# Patient Record
Sex: Female | Born: 1960 | Race: White | Hispanic: No | Marital: Married | Smoking: Former smoker
Health system: Southern US, Community
[De-identification: ages and names within clinical notes are randomized; demographics above are authoritative.]

## PROBLEM LIST (undated history)

## (undated) DIAGNOSIS — F329 Major depressive disorder, single episode, unspecified: Secondary | ICD-10-CM

## (undated) DIAGNOSIS — I10 Essential (primary) hypertension: Secondary | ICD-10-CM

## (undated) DIAGNOSIS — Z973 Presence of spectacles and contact lenses: Secondary | ICD-10-CM

## (undated) DIAGNOSIS — R12 Heartburn: Secondary | ICD-10-CM

## (undated) DIAGNOSIS — F32A Depression, unspecified: Secondary | ICD-10-CM

## (undated) DIAGNOSIS — K219 Gastro-esophageal reflux disease without esophagitis: Secondary | ICD-10-CM

## (undated) DIAGNOSIS — E785 Hyperlipidemia, unspecified: Secondary | ICD-10-CM

## (undated) HISTORY — PX: KNEE SURGERY: SHX244

## (undated) HISTORY — DX: Depression, unspecified: F32.A

## (undated) HISTORY — DX: Essential (primary) hypertension: I10

## (undated) HISTORY — DX: Hyperlipidemia, unspecified: E78.5

## (undated) HISTORY — DX: Major depressive disorder, single episode, unspecified: F32.9

## (undated) HISTORY — DX: Heartburn: R12

---

## 2004-01-11 HISTORY — PX: TONSILLECTOMY: SUR1361

## 2004-03-01 ENCOUNTER — Ambulatory Visit: Payer: Self-pay

## 2004-09-22 ENCOUNTER — Ambulatory Visit: Payer: Self-pay | Admitting: Otolaryngology

## 2004-12-15 ENCOUNTER — Ambulatory Visit: Payer: Self-pay | Admitting: Otolaryngology

## 2005-04-11 ENCOUNTER — Ambulatory Visit: Payer: Self-pay

## 2006-04-13 ENCOUNTER — Ambulatory Visit: Payer: Self-pay

## 2007-04-16 ENCOUNTER — Ambulatory Visit: Payer: Self-pay

## 2007-08-16 ENCOUNTER — Encounter (INDEPENDENT_AMBULATORY_CARE_PROVIDER_SITE_OTHER): Payer: Self-pay | Admitting: Orthopedic Surgery

## 2007-08-16 ENCOUNTER — Ambulatory Visit (HOSPITAL_COMMUNITY): Admission: RE | Admit: 2007-08-16 | Discharge: 2007-08-17 | Payer: Self-pay | Admitting: Orthopedic Surgery

## 2008-03-14 ENCOUNTER — Ambulatory Visit: Payer: Self-pay

## 2008-05-15 ENCOUNTER — Ambulatory Visit: Payer: Self-pay

## 2009-07-02 ENCOUNTER — Ambulatory Visit: Payer: Self-pay

## 2010-05-25 NOTE — Op Note (Signed)
Hannah Ferguson, Hannah Ferguson                  ACCOUNT NO.:  [id number]   MEDICAL RECORD NO.:  [id number]          PATIENT TYPE:  AMB   LOCATION:  DAY                          FACILITY:  [facility]   PHYSICIAN:  Georges Lynch. Gioffre, M.D.DATE OF BIRTH:  07-08-1960   DATE OF PROCEDURE:  08/16/2007  DATE OF DISCHARGE:                               OPERATIVE REPORT   ADDENDUM:  I just dictated a surgical dictation.  Please send a copy of  that surgical dictation to the referring person named Yolanda Bonine,  C.N.M. at the [facility] in [place].           ______________________________  Georges Lynch. Darrelyn Hillock, M.D.     RAG/MEDQ  D:  08/16/2007  T:  08/16/2007  Job:  [id number]

## 2010-05-25 NOTE — Op Note (Signed)
NAMESHANEN, NORRIS                  ACCOUNT NO.:  [id number]   MEDICAL RECORD NO.:  [id number]          PATIENT TYPE:  AMB   LOCATION:  DAY                          FACILITY:  [facility]   PHYSICIAN:  Georges Lynch. Gioffre, M.D.DATE OF BIRTH:  [birth year]   DATE OF PROCEDURE:  08/16/2007  DATE OF DISCHARGE:                               OPERATIVE REPORT   SURGEON:  Georges Lynch. Darrelyn Hillock, M.D.   ASSISTANT:  Nurse.   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   OPERATION:  Excision of a large osteochondroma right distal lateral  femur.   PROCEDURE:  Under general anesthesia, routine orthopedic prep and  draping of the right lower extremity was carried out.  The patient had 2  g IV Ancef preop.  At this time, the leg was exsanguinated with an  Esmarch, and the tourniquet was elevated to 350 mmHg.  An incision was  made over the lateral aspect of the right distal femur.  Bleeders were  identified and cauterized.  The incision was carried down through the  iliotibial band.  I then split the vastus lateralis.  I then identified  a large osteochondroma with a cartilaginous cap.  I then utilized the  osteotome and made multiple passes to remove the osteochondroma.  I  thoroughly irrigated out the area and made sure there were no loose  fragments present.  Following that, I bone waxed the raw end of the  bone.  At this particular time, I closed the vastus lateralis muscle  with #1 Vicryl sutures.  The iliotibial band was closed with #1 Vicryl  suture, subcutaneous with 0 Vicryl, skin with metal staples, and a  sterile Neosporin dressing was applied.  I did insert some thrombin-  soaked Gelfoam for hemostasis purposes into the wound.  Sterile  dressings were applied.  The patient left the operating room in  satisfactory condition postop.   1. Specimen was sent to  the lab for analysis to make sure we were not      dealing with a malignant tumor.  2. I am going to put her on a Coumadin and heparin protocol.  3. I am going to keep her overnight for observation.           ______________________________  Georges Lynch Darrelyn Hillock, M.D.     RAG/MEDQ  D:  08/16/2007  T:  08/16/2007  Job:  [id number]

## 2010-06-09 ENCOUNTER — Ambulatory Visit (INDEPENDENT_AMBULATORY_CARE_PROVIDER_SITE_OTHER): Payer: BC Managed Care – PPO | Admitting: Family Medicine

## 2010-06-09 ENCOUNTER — Encounter: Payer: Self-pay | Admitting: Family Medicine

## 2010-06-09 DIAGNOSIS — R300 Dysuria: Secondary | ICD-10-CM

## 2010-06-09 DIAGNOSIS — Z0000 Encounter for general adult medical examination without abnormal findings: Secondary | ICD-10-CM | POA: Insufficient documentation

## 2010-06-09 DIAGNOSIS — E785 Hyperlipidemia, unspecified: Secondary | ICD-10-CM | POA: Insufficient documentation

## 2010-06-09 DIAGNOSIS — N390 Urinary tract infection, site not specified: Secondary | ICD-10-CM

## 2010-06-09 DIAGNOSIS — Z1231 Encounter for screening mammogram for malignant neoplasm of breast: Secondary | ICD-10-CM

## 2010-06-09 DIAGNOSIS — I10 Essential (primary) hypertension: Secondary | ICD-10-CM | POA: Insufficient documentation

## 2010-06-09 LAB — POCT URINALYSIS DIPSTICK
Bilirubin, UA: NEGATIVE
Glucose, UA: NEGATIVE
Nitrite, UA: NEGATIVE
Spec Grav, UA: 1.015
Urobilinogen, UA: NEGATIVE

## 2010-06-09 LAB — BASIC METABOLIC PANEL
BUN: 16 mg/dL (ref 6–23)
Calcium: 9.5 mg/dL (ref 8.4–10.5)
Creatinine, Ser: 0.8 mg/dL (ref 0.4–1.2)
GFR: 77.97 mL/min (ref >60.00)
Potassium: 3.9 mEq/L (ref 3.5–5.1)

## 2010-06-09 LAB — LIPID PANEL
Cholesterol: 240 mg/dL — ABNORMAL HIGH (ref 0–200)
Total CHOL/HDL Ratio: 4
VLDL: 27 mg/dL (ref 0.0–40.0)

## 2010-06-09 LAB — LDL CHOLESTEROL, DIRECT: Direct LDL: 185.9 mg/dL

## 2010-06-09 MED ORDER — LEVONORGEST-ETH ESTRAD 91-DAY 0.15-0.03 MG PO TABS: 1.0000 | ORAL_TABLET | Freq: Every day | ORAL | Status: DC

## 2010-06-09 MED ORDER — CIPROFLOXACIN HCL 500 MG PO TABS: 500.0000 mg | ORAL_TABLET | Freq: Two times a day (BID) | ORAL | Status: AC

## 2010-06-09 MED ORDER — HYDROCHLOROTHIAZIDE 25 MG PO TABS: 25.0000 mg | ORAL_TABLET | Freq: Every day | ORAL | Status: DC

## 2010-06-09 NOTE — Progress Notes (Signed)
50 yo here to establish care and for CPX.  Well woman- G2P2.   No h/o abnormal pap smears. Last pap smear was 05/2009.  See is still menstruating. OCPs have been helping regulate her periods and perimenopausal symptoms.    Dysuria- had a colonoscopy two weeks ago. Shortly after colonoscopy, developed some dysuria, increased urinary frequency. No fevers, chills, back pain, nausea or vomiting.  The PMH, PSH, Social History, Family History, Medications, and allergies have been reviewed in [facility], and have been updated if relevant.  ROS: See HPI Patient reports no  vision/ hearing changes,anorexia, weight change, fever ,adenopathy, persistant / recurrent hoarseness, swallowing issues, chest pain, edema,persistant / recurrent cough, hemoptysis, dyspnea(rest, exertional, paroxysmal nocturnal), gastrointestinal  bleeding (melena, rectal bleeding), abdominal pain, excessive heart burn, syncope, focal weakness, severe memory loss, concerning skin lesions, depression, anxiety, abnormal bruising/bleeding, major joint swelling, breast masses or abnormal vaginal bleeding.    Physical exam: BP 150/80  Pulse 78  Temp(Src) 98.4 F (36.9 C) (Oral)  Ht 5\' 2"  (1.575 m)  Wt 206 lb 1.9 oz (93.495 kg)  BMI 37.70 kg/m2  General:  Well-developed,well-nourished,in no acute distress; alert,appropriate and cooperative throughout examination Head:  normocephalic and atraumatic.   Eyes:  vision grossly intact, pupils equal, pupils round, and pupils reactive to light.   Ears:  R ear normal and L ear normal.   Nose:  no external deformity.   Mouth:  good dentition.   Neck:  No deformities, masses, or tenderness noted. Breasts:  No mass, nodules, thickening, tenderness, bulging, retraction, inflamation, nipple discharge or skin changes noted.   Lungs:  Normal respiratory effort, chest expands symmetrically. Lungs are clear to auscultation, no crackles or wheezes. Heart:  Normal rate and regular rhythm. S1 and S2  normal without gallop, murmur, click, rub or other extra sounds. Abdomen:  Bowel sounds positive,abdomen soft and non-tender without masses, organomegaly or hernias noted. Msk:  No deformity or scoliosis noted of thoracic or lumbar spine.   Extremities:  No clubbing, cyanosis, edema, or deformity noted with normal full range of motion of all joints.   Neurologic:  alert & oriented X3 and gait normal.   Skin:  Intact without suspicious lesions or rashes Cervical Nodes:  No lymphadenopathy noted Axillary Nodes:  No palpable lymphadenopathy Psych:  Cognition and judgment appear intact. Alert and cooperative with normal attention span and concentration. No apparent delusions, illusions, hallucinations

## 2010-06-09 NOTE — Assessment & Plan Note (Signed)
New. UA positive. Start 3 day course of Cipro.

## 2010-06-09 NOTE — Patient Instructions (Signed)
Please stop by to see Hannah Ferguson on your way out. It was great to meet you.

## 2010-06-10 ENCOUNTER — Other Ambulatory Visit: Payer: Self-pay | Admitting: *Deleted

## 2010-06-10 MED ORDER — SIMVASTATIN 10 MG PO TABS: 10.0000 mg | ORAL_TABLET | Freq: Every day | ORAL | Status: DC

## 2010-06-10 NOTE — Progress Notes (Signed)
Addended by: Dianne Dun on: 06/10/2010 12:56 PM   Modules accepted: Orders

## 2010-06-20 ENCOUNTER — Ambulatory Visit: Payer: Self-pay | Admitting: Internal Medicine

## 2010-07-15 ENCOUNTER — Ambulatory Visit: Payer: Self-pay | Admitting: Family Medicine

## 2010-07-20 ENCOUNTER — Encounter: Payer: Self-pay | Admitting: Family Medicine

## 2010-08-13 ENCOUNTER — Other Ambulatory Visit (INDEPENDENT_AMBULATORY_CARE_PROVIDER_SITE_OTHER): Payer: BC Managed Care – PPO | Admitting: Family Medicine

## 2010-08-13 ENCOUNTER — Encounter: Payer: Self-pay | Admitting: Family Medicine

## 2010-08-13 ENCOUNTER — Ambulatory Visit (INDEPENDENT_AMBULATORY_CARE_PROVIDER_SITE_OTHER): Payer: BC Managed Care – PPO | Admitting: Family Medicine

## 2010-08-13 VITALS — BP 124/78 | HR 84 | Temp 98.1°F | Wt 211.0 lb

## 2010-08-13 DIAGNOSIS — E785 Hyperlipidemia, unspecified: Secondary | ICD-10-CM

## 2010-08-13 DIAGNOSIS — R300 Dysuria: Secondary | ICD-10-CM

## 2010-08-13 DIAGNOSIS — N390 Urinary tract infection, site not specified: Secondary | ICD-10-CM

## 2010-08-13 LAB — POCT URINALYSIS DIPSTICK
Ketones, UA: NEGATIVE
Nitrite, UA: NEGATIVE
pH, UA: 7

## 2010-08-13 LAB — LIPID PANEL
HDL: 49.4 mg/dL (ref >39.00)
LDL Cholesterol: 104 mg/dL — ABNORMAL HIGH (ref 0–99)
Total CHOL/HDL Ratio: 4
Triglycerides: 98 mg/dL (ref 0.0–149.0)

## 2010-08-13 LAB — HEPATIC FUNCTION PANEL: Albumin: 3.7 g/dL (ref 3.5–5.2)

## 2010-08-13 MED ORDER — CIPROFLOXACIN HCL 500 MG PO TABS: 500.0000 mg | ORAL_TABLET | Freq: Two times a day (BID) | ORAL | Status: DC

## 2010-08-13 NOTE — Patient Instructions (Signed)
Sounds like UTI. Would like to recollect urine to send for culture. Treat with cipro 500mg  twice daily for 5 days. Push fluids, cranberry juice. Call us if not improving as expected, any worsening abdominal pain, nausea or vomiting, or fever >101.

## 2010-08-13 NOTE — Assessment & Plan Note (Signed)
UA seems contaminated. Will recollect and send for culture. Treat with cipro 500mg  twice daily for 5 days given story consistent with UTI and given previous 3d course wasn't enough. Update if not improved.  Will call with results of UCx.

## 2010-08-13 NOTE — Progress Notes (Signed)
  Subjective:    Patient ID: Hannah Ferguson, female    DOB: 06-Jun-1960, 50 y.o.   MRN: [id number]  HPI CC: I have UTI i think  5d h/o dysuria, urgency, frequency.  Has had UTIs in past.  Last one was end of May, treated with 3d course abx, did not resolve sxs so went to [facility] for prolonged course.  + lower back pain currently.  Drinking plenty of water.  No fevers/chills, abd pain, flank pain, n/v.  No blood in urine.  Review of Systems Per HPI    Objective:   Physical Exam  Nursing note and vitals reviewed. Constitutional: She appears well-developed and well-nourished. No distress.  Abdominal: Soft. Bowel sounds are normal. She exhibits no distension. There is no tenderness. There is no rebound, no guarding and no CVA tenderness.  Musculoskeletal: She exhibits no edema.  Skin: Skin is warm and dry. No rash noted.  Psychiatric: She has a normal mood and affect.          Assessment & Plan:

## 2010-08-16 ENCOUNTER — Telehealth: Payer: Self-pay | Admitting: Family Medicine

## 2010-08-16 LAB — URINE CULTURE: Colony Count: >=100000

## 2010-08-16 MED ORDER — CEPHALEXIN 500 MG PO CAPS: 500.0000 mg | ORAL_CAPSULE | Freq: Two times a day (BID) | ORAL | Status: AC

## 2010-08-16 NOTE — Telephone Encounter (Signed)
Patient notified and will begin Keflex. Encouraged her to eat yogurt with it to help avoid yeast infection. She verbalized understanding.

## 2010-08-16 NOTE — Telephone Encounter (Signed)
Please notify urine culture returned with bacteria resistant to cipro. Would have her stop cipro and start keflex twice daily for 7 days.  Sulfa allergy. Eat yogurt with this.

## 2010-08-26 ENCOUNTER — Other Ambulatory Visit: Payer: Self-pay | Admitting: *Deleted

## 2010-08-26 MED ORDER — SIMVASTATIN 10 MG PO TABS: 10.0000 mg | ORAL_TABLET | Freq: Every day | ORAL | Status: DC

## 2010-10-08 LAB — COMPREHENSIVE METABOLIC PANEL
AST: 17
Albumin: 3.5
Alkaline Phosphatase: 32 — ABNORMAL LOW
Chloride: 107
Creatinine, Ser: 0.79
GFR calc Af Amer: >60
Potassium: 3.9
Total Bilirubin: 0.6

## 2010-10-08 LAB — CBC
Platelets: 235
WBC: 6.6

## 2010-10-08 LAB — PROTIME-INR
INR: 1
Prothrombin Time: 13.8

## 2010-10-08 LAB — PREGNANCY, URINE: Preg Test, Ur: NEGATIVE

## 2010-12-24 ENCOUNTER — Ambulatory Visit: Payer: Self-pay

## 2011-02-20 ENCOUNTER — Ambulatory Visit: Payer: Self-pay

## 2011-02-20 LAB — URINALYSIS, COMPLETE
Glucose,UR: NEGATIVE mg/dL (ref 0–75)
Nitrite: NEGATIVE
Ph: 6.5 (ref 4.5–8.0)
Protein: 300
Specific Gravity: 1.025 (ref 1.003–1.030)

## 2011-02-22 LAB — URINE CULTURE

## 2011-03-24 ENCOUNTER — Ambulatory Visit: Payer: Self-pay

## 2011-03-24 LAB — URINALYSIS, COMPLETE

## 2011-03-26 LAB — URINE CULTURE

## 2011-04-13 ENCOUNTER — Ambulatory Visit: Payer: Self-pay | Admitting: Urology

## 2011-05-22 ENCOUNTER — Other Ambulatory Visit: Payer: Self-pay | Admitting: Family Medicine

## 2011-06-07 ENCOUNTER — Other Ambulatory Visit: Payer: Self-pay | Admitting: *Deleted

## 2011-06-07 MED ORDER — HYDROCHLOROTHIAZIDE 25 MG PO TABS: 25.0000 mg | ORAL_TABLET | Freq: Every day | ORAL | Status: DC

## 2011-06-14 ENCOUNTER — Other Ambulatory Visit: Payer: Self-pay | Admitting: Family Medicine

## 2011-06-14 DIAGNOSIS — I10 Essential (primary) hypertension: Secondary | ICD-10-CM

## 2011-06-14 DIAGNOSIS — Z0000 Encounter for general adult medical examination without abnormal findings: Secondary | ICD-10-CM

## 2011-06-14 DIAGNOSIS — E785 Hyperlipidemia, unspecified: Secondary | ICD-10-CM

## 2011-06-21 ENCOUNTER — Other Ambulatory Visit (INDEPENDENT_AMBULATORY_CARE_PROVIDER_SITE_OTHER): Payer: BC Managed Care – PPO

## 2011-06-21 DIAGNOSIS — Z0000 Encounter for general adult medical examination without abnormal findings: Secondary | ICD-10-CM

## 2011-06-21 DIAGNOSIS — E785 Hyperlipidemia, unspecified: Secondary | ICD-10-CM

## 2011-06-21 DIAGNOSIS — I10 Essential (primary) hypertension: Secondary | ICD-10-CM

## 2011-06-21 LAB — CBC WITH DIFFERENTIAL/PLATELET
Basophils Absolute: 0.1 10*3/uL (ref 0.0–0.1)
Basophils Relative: 1.4 % (ref 0.0–3.0)
Eosinophils Absolute: 0.2 10*3/uL (ref 0.0–0.7)
Hemoglobin: 15.4 g/dL — ABNORMAL HIGH (ref 12.0–15.0)
Lymphocytes Relative: 43 % (ref 12.0–46.0)
Monocytes Relative: 7.9 % (ref 3.0–12.0)
Neutro Abs: 3.2 10*3/uL (ref 1.4–7.7)
Neutrophils Relative %: 44.7 % (ref 43.0–77.0)
RBC: 5.01 Mil/uL (ref 3.87–5.11)
RDW: 12.4 % (ref 11.5–14.6)

## 2011-06-21 LAB — COMPREHENSIVE METABOLIC PANEL
ALT: 32 U/L (ref 0–35)
AST: 26 U/L (ref 0–37)
Albumin: 3.6 g/dL (ref 3.5–5.2)
CO2: 29 mEq/L (ref 19–32)
Calcium: 9.2 mg/dL (ref 8.4–10.5)
Chloride: 107 mEq/L (ref 96–112)
Creatinine, Ser: 0.9 mg/dL (ref 0.4–1.2)
GFR: 72.53 mL/min (ref >60.00)
Potassium: 3.4 mEq/L — ABNORMAL LOW (ref 3.5–5.1)

## 2011-06-21 LAB — LIPID PANEL
LDL Cholesterol: 110 mg/dL — ABNORMAL HIGH (ref 0–99)
Total CHOL/HDL Ratio: 4
VLDL: 16.6 mg/dL (ref 0.0–40.0)

## 2011-06-28 ENCOUNTER — Encounter: Payer: BC Managed Care – PPO | Admitting: Family Medicine

## 2011-06-29 ENCOUNTER — Ambulatory Visit (INDEPENDENT_AMBULATORY_CARE_PROVIDER_SITE_OTHER): Payer: BC Managed Care – PPO | Admitting: Family Medicine

## 2011-06-29 ENCOUNTER — Encounter: Payer: Self-pay | Admitting: Family Medicine

## 2011-06-29 VITALS — BP 110/82 | HR 80 | Temp 98.0°F | Ht 62.75 in | Wt 216.0 lb

## 2011-06-29 DIAGNOSIS — Z1239 Encounter for other screening for malignant neoplasm of breast: Secondary | ICD-10-CM

## 2011-06-29 DIAGNOSIS — Z0000 Encounter for general adult medical examination without abnormal findings: Secondary | ICD-10-CM

## 2011-06-29 DIAGNOSIS — I10 Essential (primary) hypertension: Secondary | ICD-10-CM

## 2011-06-29 DIAGNOSIS — E785 Hyperlipidemia, unspecified: Secondary | ICD-10-CM

## 2011-06-29 MED ORDER — ALPRAZOLAM 0.5 MG PO TABS: 0.5000 mg | ORAL_TABLET | Freq: Every evening | ORAL | Status: DC | PRN

## 2011-06-29 MED ORDER — OMEPRAZOLE 40 MG PO CPDR: 40.0000 mg | DELAYED_RELEASE_CAPSULE | Freq: Every day | ORAL | Status: DC

## 2011-06-29 MED ORDER — SIMVASTATIN 10 MG PO TABS: 10.0000 mg | ORAL_TABLET | Freq: Every day | ORAL | Status: DC

## 2011-06-29 MED ORDER — HYDROCHLOROTHIAZIDE 25 MG PO TABS: 25.0000 mg | ORAL_TABLET | Freq: Every day | ORAL | Status: DC

## 2011-06-29 NOTE — Progress Notes (Signed)
51 yo here for CPX.  Well woman- G2P2.   No h/o abnormal pap smears. Last pap smear was 05/2009.  Early menopause- has not had a period in months. No insomnia, no moodiness.   Husband lost his job but she feels that they are dealing with everything well. Does want a refill on xanax which she has not used in over a year. Denies any depression. No SI or HI.  HLD- On Zocor 10 mg qhs. Lab Results  Component Value Date   CHOL 176 06/21/2011   HDL 49.80 06/21/2011   LDLCALC 110* 06/21/2011   LDLDIRECT 185.9 06/09/2010   TRIG 83.0 06/21/2011   CHOLHDL 4 06/21/2011     Patient Active Problem List  Diagnosis  . Hypertension  . Hyperlipidemia  . Routine general medical examination at a health care facility  . UTI (urinary tract infection)   Past Medical History  Diagnosis Date  . Hypertension   . Hyperlipidemia    Past Surgical History  Procedure Date  . Knee surgery   . Tonsillectomy 2006   History  Substance Use Topics  . Smoking status: Never Smoker   . Smokeless tobacco: Not on file  . Alcohol Use: Not on file   Family History  Problem Relation Age of Onset  . Multiple sclerosis Mother   . Cancer Father    Allergies  Allergen Reactions  . Sulfa Antibiotics Rash   Current Outpatient Prescriptions on File Prior to Visit  Medication Sig Dispense Refill  . Calcium Carbonate-Vit D-Min (CALCIUM 1200 PO) Take 1 tablet by mouth daily.        . fish oil-omega-3 fatty acids 1000 MG capsule Take 1 g by mouth daily.        . hydrochlorothiazide (HYDRODIURIL) 25 MG tablet Take 1 tablet (25 mg total) by mouth daily.  90 tablet  0  . levonorgestrel-ethinyl estradiol (JOLESSA) 0.15-0.03 MG per tablet Take 1 tablet by mouth daily.  30 tablet  11  . omeprazole (PRILOSEC) 40 MG capsule Take 40 mg by mouth daily.        . simvastatin (ZOCOR) 10 MG tablet TAKE 1 TABLET (10 MG TOTAL) BY MOUTH AT BEDTIME.  30 tablet  0    The PMH, PSH, Social History, Family History, Medications, and  allergies have been reviewed in [facility], and have been updated if relevant.  ROS: See HPI Patient reports no  vision/ hearing changes,anorexia, weight change, fever ,adenopathy, persistant / recurrent hoarseness, swallowing issues, chest pain, edema,persistant / recurrent cough, hemoptysis, dyspnea(rest, exertional, paroxysmal nocturnal), gastrointestinal  bleeding (melena, rectal bleeding), abdominal pain, excessive heart burn, syncope, focal weakness, severe memory loss, concerning skin lesions, depression, anxiety, abnormal bruising/bleeding, major joint swelling, breast masses or abnormal vaginal bleeding.    Physical exam :BP 110/82  Pulse 80  Temp 98 F (36.7 C)  Ht 5' 2.75" (1.594 m)  Wt 216 lb (97.977 kg)  BMI 38.57 kg/m2 General:  Well-developed,well-nourished,in no acute distress; alert,appropriate and cooperative throughout examination Head:  normocephalic and atraumatic.   Eyes:  vision grossly intact, pupils equal, pupils round, and pupils reactive to light.   Ears:  R ear normal and L ear normal.   Nose:  no external deformity.   Mouth:  good dentition.   Neck:  No deformities, masses, or tenderness noted. Breasts:  No mass, nodules, thickening, tenderness, bulging, retraction, inflamation, nipple discharge or skin changes noted.   Lungs:  Normal respiratory effort, chest expands symmetrically. Lungs are clear to auscultation, no  crackles or wheezes. Heart:  Normal rate and regular rhythm. S1 and S2 normal without gallop, murmur, click, rub or other extra sounds. Abdomen:  Bowel sounds positive,abdomen soft and non-tender without masses, organomegaly or hernias noted. Msk:  No deformity or scoliosis noted of thoracic or lumbar spine.   Extremities:  No clubbing, cyanosis, edema, or deformity noted with normal full range of motion of all joints.   Neurologic:  alert & oriented X3 and gait normal.   Skin:  Intact without suspicious lesions or rashes Cervical Nodes:  No  lymphadenopathy noted Psych:  Cognition and judgent appear intact. Alert and cooperative with normal attention span and concentration. No apparent delusions, illusions, hallucinations  Assessment and Plan:  1. Routine general medical examination at a health care facility  Reviewed preventive care protocols, scheduled due services, and updated immunizations Discussed nutrition, exercise, diet, and healthy lifestyle.    2. Screening for breast cancer  MM Digital Screening  3. Hypertension  Stable.   4. Hyperlipidemia  Stable on current meds.

## 2011-06-29 NOTE — Patient Instructions (Addendum)
Great to see you. Please stop by to see Shirlee Limerick to set up your mammogram.

## 2011-07-19 ENCOUNTER — Encounter: Payer: Self-pay | Admitting: Family Medicine

## 2011-07-19 ENCOUNTER — Ambulatory Visit: Payer: Self-pay | Admitting: Family Medicine

## 2011-07-22 ENCOUNTER — Encounter: Payer: Self-pay | Admitting: Family Medicine

## 2011-07-22 ENCOUNTER — Encounter: Payer: Self-pay | Admitting: *Deleted

## 2011-09-07 ENCOUNTER — Encounter: Payer: Self-pay | Admitting: Family Medicine

## 2011-09-07 ENCOUNTER — Ambulatory Visit (INDEPENDENT_AMBULATORY_CARE_PROVIDER_SITE_OTHER): Payer: BC Managed Care – PPO | Admitting: Family Medicine

## 2011-09-07 VITALS — BP 130/80 | HR 60 | Temp 98.0°F | Wt 218.0 lb

## 2011-09-07 DIAGNOSIS — F32A Depression, unspecified: Secondary | ICD-10-CM | POA: Insufficient documentation

## 2011-09-07 DIAGNOSIS — F329 Major depressive disorder, single episode, unspecified: Secondary | ICD-10-CM

## 2011-09-07 MED ORDER — OMEPRAZOLE 20 MG PO CPDR: DELAYED_RELEASE_CAPSULE | ORAL | Status: DC

## 2011-09-07 MED ORDER — BUPROPION HCL ER (XL) 150 MG PO TB24: 150.0000 mg | ORAL_TABLET | ORAL | Status: DC

## 2011-09-07 NOTE — Addendum Note (Signed)
Addended by: Eliezer Bottom on: 09/07/2011 09:37 AM   Modules accepted: Orders

## 2011-09-07 NOTE — Progress Notes (Signed)
  Subjective:    Patient ID: Hannah Ferguson, female    DOB: Dec 06, 1960, 51 y.o.   MRN: [id number]  HPI  51 yo here to discuss depression.  Past few months, feels much less interested in doing things she likes to do. She has always been very motivated to work, has noticed that she dreads going to work.  Husband lost his job this summer but found another one.  She is going through "empty nest syndrome."  Denies any anxiety.  Sleep ok- takes an occasional xanax to help to sleep- no more than once a week.  Appetite good - she thinks it is "too good."  Has only been on antidepressant once in 2005- had multiple life stressors at that time- was working full time, caring for her mom with MS, brother was addicted to cocaine.  She took effexor for short period of time- she did not like how it made her feel.  No SI or HI.  Patient Active Problem List  Diagnosis  . Hypertension  . Hyperlipidemia  . Routine general medical examination at a health care facility  . UTI (urinary tract infection)  . Depression   Past Medical History  Diagnosis Date  . Hypertension   . Hyperlipidemia    Past Surgical History  Procedure Date  . Knee surgery   . Tonsillectomy 2006   History  Substance Use Topics  . Smoking status: Never Smoker   . Smokeless tobacco: Not on file  . Alcohol Use: Not on file   Family History  Problem Relation Age of Onset  . Multiple sclerosis Mother   . Cancer Father    Allergies  Allergen Reactions  . Sulfa Antibiotics Rash   Current Outpatient Prescriptions on File Prior to Visit  Medication Sig Dispense Refill  . ALPRAZolam (XANAX) 0.5 MG tablet Take 1 tablet (0.5 mg total) by mouth at bedtime as needed.  30 tablet  0  . Calcium Carbonate-Vit D-Min (CALCIUM 1200 PO) Take 1 tablet by mouth daily.        . fish oil-omega-3 fatty acids 1000 MG capsule Take 1 g by mouth daily.        . hydrochlorothiazide (HYDRODIURIL) 25 MG tablet Take 1 tablet (25 mg total) by  mouth daily.  90 tablet  3  . omeprazole (PRILOSEC) 40 MG capsule Take 1 capsule (40 mg total) by mouth daily.  90 capsule  3  . simvastatin (ZOCOR) 10 MG tablet Take 1 tablet (10 mg total) by mouth at bedtime.  90 tablet  3  . buPROPion (WELLBUTRIN XL) 150 MG 24 hr tablet Take 1 tablet (150 mg total) by mouth every morning.  30 tablet  1   The PMH, PSH, Social History, Family History, Medications, and allergies have been reviewed in [facility], and have been updated if relevant.    Review of Systems    See HPI Objective:   Physical Exam BP 130/80  Pulse 60  Temp 98 F (36.7 C)  Wt 218 lb (98.884 kg) Gen:  Alert, pleasant, NAD Psych:  Good eye contact, not anxious or depressed appearing.    Assessment & Plan:   1. Depression    >25 min spent with face to face with patient, >50% counseling and/or coordinating care. She would like to defer psychotherapy at this time. Start Wellbutrin 150 mg XL daily. Follow up in 3-4 weeks. The patient indicates understanding of these issues and agrees with the plan.

## 2011-09-07 NOTE — Patient Instructions (Addendum)
Great to see you. Please call in 3 weeks and give me an update.  Hang in there.

## 2011-10-20 ENCOUNTER — Other Ambulatory Visit: Payer: Self-pay

## 2011-10-20 MED ORDER — BUPROPION HCL ER (XL) 150 MG PO TB24: 150.0000 mg | ORAL_TABLET | ORAL | Status: DC

## 2011-10-20 NOTE — Telephone Encounter (Signed)
Advised patient

## 2011-10-20 NOTE — Telephone Encounter (Signed)
Pt request refill Wellbutrin to [facility] [place]. Pt feels better, has more energy and less depressed.  Once a week pt goes to sleep at 10 pm and wakes up 3AM and stays awake for 1-2 hours. Other nights sleeps well.Please advise.

## 2011-10-20 NOTE — Telephone Encounter (Signed)
Sounds like she is feeling better.  I am happy to hear that. Wellbutrin refilled.

## 2011-11-07 ENCOUNTER — Other Ambulatory Visit: Payer: Self-pay | Admitting: *Deleted

## 2011-11-07 MED ORDER — ALPRAZOLAM 0.5 MG PO TABS: 0.5000 mg | ORAL_TABLET | Freq: Every evening | ORAL | Status: DC | PRN

## 2011-11-07 NOTE — Telephone Encounter (Signed)
Medicine called to pharmacy. 

## 2012-04-30 ENCOUNTER — Other Ambulatory Visit: Payer: Self-pay | Admitting: *Deleted

## 2012-04-30 ENCOUNTER — Encounter: Payer: Self-pay | Admitting: *Deleted

## 2012-04-30 MED ORDER — OMEPRAZOLE 20 MG PO CPDR: DELAYED_RELEASE_CAPSULE | ORAL | Status: DC

## 2012-04-30 MED ORDER — BUPROPION HCL ER (XL) 150 MG PO TB24: 150.0000 mg | ORAL_TABLET | ORAL | Status: DC

## 2012-07-25 ENCOUNTER — Encounter: Payer: Self-pay | Admitting: Radiology

## 2012-07-26 ENCOUNTER — Ambulatory Visit (INDEPENDENT_AMBULATORY_CARE_PROVIDER_SITE_OTHER): Payer: BC Managed Care – PPO | Admitting: Family Medicine

## 2012-07-26 ENCOUNTER — Other Ambulatory Visit (HOSPITAL_COMMUNITY)
Admission: RE | Admit: 2012-07-26 | Discharge: 2012-07-26 | Disposition: A | Discharge status: Home / Self Care | Payer: BC Managed Care – PPO | Source: Ambulatory Visit | Attending: Family Medicine | Admitting: Family Medicine

## 2012-07-26 ENCOUNTER — Encounter: Payer: Self-pay | Admitting: Family Medicine

## 2012-07-26 VITALS — BP 100/70 | HR 76 | Temp 98.0°F | Ht 62.5 in | Wt 207.0 lb

## 2012-07-26 DIAGNOSIS — Z0000 Encounter for general adult medical examination without abnormal findings: Secondary | ICD-10-CM

## 2012-07-26 DIAGNOSIS — Z23 Encounter for immunization: Secondary | ICD-10-CM

## 2012-07-26 DIAGNOSIS — Z1151 Encounter for screening for human papillomavirus (HPV): Secondary | ICD-10-CM | POA: Insufficient documentation

## 2012-07-26 DIAGNOSIS — I10 Essential (primary) hypertension: Secondary | ICD-10-CM

## 2012-07-26 DIAGNOSIS — F329 Major depressive disorder, single episode, unspecified: Secondary | ICD-10-CM

## 2012-07-26 DIAGNOSIS — E785 Hyperlipidemia, unspecified: Secondary | ICD-10-CM

## 2012-07-26 DIAGNOSIS — Z01419 Encounter for gynecological examination (general) (routine) without abnormal findings: Secondary | ICD-10-CM | POA: Insufficient documentation

## 2012-07-26 DIAGNOSIS — F32A Depression, unspecified: Secondary | ICD-10-CM

## 2012-07-26 DIAGNOSIS — Z1231 Encounter for screening mammogram for malignant neoplasm of breast: Secondary | ICD-10-CM

## 2012-07-26 LAB — COMPREHENSIVE METABOLIC PANEL
ALT: 29 U/L (ref 0–35)
AST: 19 U/L (ref 0–37)
Albumin: 3.9 g/dL (ref 3.5–5.2)
Alkaline Phosphatase: 58 U/L (ref 39–117)
BUN: 16 mg/dL (ref 6–23)
CO2: 29 mEq/L (ref 19–32)
Calcium: 9.5 mg/dL (ref 8.4–10.5)
Chloride: 101 mEq/L (ref 96–112)
Creatinine, Ser: 1 mg/dL (ref 0.4–1.2)
GFR: 59.44 mL/min — ABNORMAL LOW (ref >60.00)
Glucose, Bld: 99 mg/dL (ref 70–99)
Potassium: 3.3 mEq/L — ABNORMAL LOW (ref 3.5–5.1)
Sodium: 139 mEq/L (ref 135–145)
Total Bilirubin: 0.6 mg/dL (ref 0.3–1.2)
Total Protein: 7 g/dL (ref 6.0–8.3)

## 2012-07-26 LAB — CBC WITH DIFFERENTIAL/PLATELET
Basophils Absolute: 0.1 10*3/uL (ref 0.0–0.1)
Eosinophils Relative: 2.5 % (ref 0.0–5.0)
HCT: 44.5 % (ref 36.0–46.0)
Lymphocytes Relative: 46.3 % — ABNORMAL HIGH (ref 12.0–46.0)
Lymphs Abs: 3.3 10*3/uL (ref 0.7–4.0)
Monocytes Relative: 7.9 % (ref 3.0–12.0)
Neutrophils Relative %: 42.3 % — ABNORMAL LOW (ref 43.0–77.0)
Platelets: 273 10*3/uL (ref 150.0–400.0)
RDW: 12.7 % (ref 11.5–14.6)
WBC: 7.2 10*3/uL (ref 4.5–10.5)

## 2012-07-26 LAB — LIPID PANEL
Cholesterol: 238 mg/dL — ABNORMAL HIGH (ref 0–200)
HDL: 64.9 mg/dL (ref >39.00)
Total CHOL/HDL Ratio: 4
Triglycerides: 107 mg/dL (ref 0.0–149.0)
VLDL: 21.4 mg/dL (ref 0.0–40.0)

## 2012-07-26 LAB — LDL CHOLESTEROL, DIRECT: Direct LDL: 152.8 mg/dL

## 2012-07-26 MED ORDER — ALPRAZOLAM 0.5 MG PO TABS: 0.5000 mg | ORAL_TABLET | Freq: Every evening | ORAL | Status: DC | PRN

## 2012-07-26 MED ORDER — HYDROCHLOROTHIAZIDE 25 MG PO TABS: 25.0000 mg | ORAL_TABLET | Freq: Every day | ORAL | Status: DC

## 2012-07-26 MED ORDER — NITROFURANTOIN MONOHYD MACRO 100 MG PO CAPS: ORAL_CAPSULE | ORAL | Status: DC

## 2012-07-26 MED ORDER — BUPROPION HCL ER (XL) 150 MG PO TB24: 150.0000 mg | ORAL_TABLET | ORAL | Status: DC

## 2012-07-26 MED ORDER — MOMETASONE FUROATE 50 MCG/ACT NA SUSP: NASAL | Status: DC

## 2012-07-26 MED ORDER — OMEPRAZOLE 20 MG PO CPDR: DELAYED_RELEASE_CAPSULE | ORAL | Status: DC

## 2012-07-26 NOTE — Progress Notes (Signed)
52 yo pleasant female here for CPX.  She has no complaints today.  Well woman- G2P2.   No h/o abnormal pap smears. Last pap smear was 05/2009. LMP one year ago.  No post menopausal bleeding.  UTD colonoscopy.  HLD- On Zocor 10 mg qhs. Lab Results  Component Value Date   CHOL 176 06/21/2011   HDL 49.80 06/21/2011   LDLCALC 110* 06/21/2011   LDLDIRECT 185.9 06/09/2010   TRIG 83.0 06/21/2011   CHOLHDL 4 06/21/2011     Patient Active Problem List   Diagnosis Date Noted  . Depression 09/07/2011  . Routine general medical examination at a health care facility 06/09/2010  . UTI (urinary tract infection) 06/09/2010  . Hypertension   . Hyperlipidemia    Past Medical History  Diagnosis Date  . Hypertension   . Hyperlipidemia    Past Surgical History  Procedure Laterality Date  . Knee surgery    . Tonsillectomy  2006   History  Substance Use Topics  . Smoking status: Never Smoker   . Smokeless tobacco: Not on file  . Alcohol Use: Not on file   Family History  Problem Relation Age of Onset  . Multiple sclerosis Mother   . Cancer Father    Allergies  Allergen Reactions  . Sulfa Antibiotics Rash   Current Outpatient Prescriptions on File Prior to Visit  Medication Sig Dispense Refill  . ALPRAZolam (XANAX) 0.5 MG tablet Take 1 tablet (0.5 mg total) by mouth at bedtime as needed.  30 tablet  0  . buPROPion (WELLBUTRIN XL) 150 MG 24 hr tablet Take 1 tablet (150 mg total) by mouth every morning.  90 tablet  0  . Calcium Carbonate-Vit D-Min (CALCIUM 1200 PO) Take 1 tablet by mouth daily.        . fish oil-omega-3 fatty acids 1000 MG capsule Take 1 g by mouth daily.        . hydrochlorothiazide (HYDRODIURIL) 25 MG tablet Take 1 tablet (25 mg total) by mouth daily.  90 tablet  3  . omeprazole (PRILOSEC) 20 MG capsule Take two by mouth daily  90 capsule  1  . simvastatin (ZOCOR) 10 MG tablet Take 1 tablet (10 mg total) by mouth at bedtime.  90 tablet  3   No current  facility-administered medications on file prior to visit.    The PMH, PSH, Social History, Family History, Medications, and allergies have been reviewed in [facility], and have been updated if relevant.  ROS: See HPI Patient reports no  vision/ hearing changes,anorexia, weight change, fever ,adenopathy, persistant / recurrent hoarseness, swallowing issues, chest pain, edema,persistant / recurrent cough, hemoptysis, dyspnea(rest, exertional, paroxysmal nocturnal), gastrointestinal  bleeding (melena, rectal bleeding), abdominal pain, excessive heart burn, syncope, focal weakness, severe memory loss, concerning skin lesions, depression, anxiety, abnormal bruising/bleeding, major joint swelling, breast masses or abnormal vaginal bleeding.    Physical exam BP 100/70  Pulse 76  Temp(Src) 98 F (36.7 C)  Ht 5' 2.5" (1.588 m)  Wt 207 lb (93.895 kg)  BMI 37.23 kg/m2  General:  Well-developed,well-nourished,in no acute distress; alert,appropriate and cooperative throughout examination Head:  normocephalic and atraumatic.   Eyes:  vision grossly intact, pupils equal, pupils round, and pupils reactive to light.   Ears:  R ear normal and L ear normal.   Nose:  no external deformity.   Mouth:  good dentition.   Neck:  No deformities, masses, or tenderness noted. Breasts:  No mass, nodules, thickening, tenderness, bulging, retraction, inflamation, nipple  discharge or skin changes noted.   Lungs:  Normal respiratory effort, chest expands symmetrically. Lungs are clear to auscultation, no crackles or wheezes. Heart:  Normal rate and regular rhythm. S1 and S2 normal without gallop, murmur, click, rub or other extra sounds. Abdomen:  Bowel sounds positive,abdomen soft and non-tender without masses, organomegaly or hernias noted. Rectal:  no external abnormalities.   Genitalia:  Pelvic Exam:        External: normal female genitalia without lesions or masses        Vagina: normal without lesions or masses         Cervix: normal without lesions or masses        Adnexa: normal bimanual exam without masses or fullness        Uterus: normal by palpation        Pap smear: performed Msk:  No deformity or scoliosis noted of thoracic or lumbar spine.   Extremities:  No clubbing, cyanosis, edema, or deformity noted with normal full range of motion of all joints.   Neurologic:  alert & oriented X3 and gait normal.   Skin:  Intact without suspicious lesions or rashes Cervical Nodes:  No lymphadenopathy noted Axillary Nodes:  No palpable lymphadenopathy Psych:  Cognition and judgment appear intact. Alert and cooperative with normal attention span and concentration. No apparent delusions, illusions, hallucinations   Assessment and Plan:  1. Routine general medical examination at a health care facility Reviewed preventive care protocols, scheduled due services, and updated immunizations Discussed nutrition, exercise, diet, and healthy lifestyle.  Pap smear today.  2. Hypertension Well controlled.  No changes.  3. Hyperlipidemia Continue Zocor.  Recheck labs today.  4. Depression Stable.  Uses only occasional xanax.  5. Other screening mammogram  - MM Digital Screening; Future

## 2012-07-26 NOTE — Patient Instructions (Addendum)
Great to see you. We will call you with your lab results.  You may also view them online.  Please stop by to see Shirlee Limerick on your way out to set up your mammogram or you can call the [place] [facility] directly.

## 2012-07-26 NOTE — Addendum Note (Signed)
Addended by: Eliezer Bottom on: 07/26/2012 09:37 AM   Modules accepted: Orders

## 2012-08-01 ENCOUNTER — Encounter: Payer: Self-pay | Admitting: Family Medicine

## 2012-08-01 ENCOUNTER — Encounter: Payer: Self-pay | Admitting: *Deleted

## 2012-08-01 LAB — HM PAP SMEAR: HM Pap smear: NORMAL

## 2012-08-03 ENCOUNTER — Other Ambulatory Visit: Payer: Self-pay | Admitting: Family Medicine

## 2012-08-09 ENCOUNTER — Ambulatory Visit: Payer: Self-pay | Admitting: Family Medicine

## 2012-08-10 ENCOUNTER — Encounter: Payer: Self-pay | Admitting: Family Medicine

## 2012-08-13 ENCOUNTER — Telehealth: Payer: Self-pay | Admitting: *Deleted

## 2012-08-14 NOTE — Telephone Encounter (Signed)
Pt is going for additional mammogram views tomorrow, 08/15/12.

## 2012-08-15 ENCOUNTER — Ambulatory Visit: Payer: Self-pay | Admitting: Family Medicine

## 2012-08-17 ENCOUNTER — Encounter: Payer: Self-pay | Admitting: Family Medicine

## 2012-08-20 ENCOUNTER — Encounter: Payer: Self-pay | Admitting: Family Medicine

## 2012-09-03 ENCOUNTER — Encounter: Payer: Self-pay | Admitting: Family Medicine

## 2012-10-26 ENCOUNTER — Other Ambulatory Visit: Payer: Self-pay | Admitting: Family Medicine

## 2012-10-28 NOTE — Telephone Encounter (Signed)
Last office visit 07/26/2012.  Ok to refill? 

## 2012-10-29 NOTE — Telephone Encounter (Signed)
Rx called to pharmacy

## 2012-11-15 ENCOUNTER — Other Ambulatory Visit: Payer: Self-pay

## 2013-01-31 ENCOUNTER — Other Ambulatory Visit: Payer: Self-pay | Admitting: Family Medicine

## 2013-03-29 ENCOUNTER — Other Ambulatory Visit: Payer: Self-pay | Admitting: Family Medicine

## 2013-04-09 ENCOUNTER — Telehealth: Payer: Self-pay | Admitting: Family Medicine

## 2013-04-09 MED ORDER — OMEPRAZOLE 20 MG PO CPDR: DELAYED_RELEASE_CAPSULE | ORAL | Status: DC

## 2013-04-09 NOTE — Telephone Encounter (Signed)
Spoke to pt and scheduled f/u appt. Refill sent as requested.

## 2013-04-09 NOTE — Telephone Encounter (Signed)
Patient was told by her pharmacy that she needed an office visit before her Omeprazole could be refilled.  Patient was seen last July for a physical.  Please call patient.

## 2013-04-22 ENCOUNTER — Encounter: Payer: Self-pay | Admitting: Family Medicine

## 2013-04-22 ENCOUNTER — Ambulatory Visit (INDEPENDENT_AMBULATORY_CARE_PROVIDER_SITE_OTHER): Payer: BC Managed Care – PPO | Admitting: Family Medicine

## 2013-04-22 VITALS — BP 110/68 | HR 66 | Temp 97.9°F | Ht 62.25 in | Wt 212.5 lb

## 2013-04-22 DIAGNOSIS — K219 Gastro-esophageal reflux disease without esophagitis: Secondary | ICD-10-CM

## 2013-04-22 DIAGNOSIS — F32A Depression, unspecified: Secondary | ICD-10-CM

## 2013-04-22 DIAGNOSIS — E785 Hyperlipidemia, unspecified: Secondary | ICD-10-CM

## 2013-04-22 DIAGNOSIS — F3289 Other specified depressive episodes: Secondary | ICD-10-CM

## 2013-04-22 DIAGNOSIS — I10 Essential (primary) hypertension: Secondary | ICD-10-CM

## 2013-04-22 DIAGNOSIS — F329 Major depressive disorder, single episode, unspecified: Secondary | ICD-10-CM

## 2013-04-22 LAB — COMPREHENSIVE METABOLIC PANEL
ALBUMIN: 3.7 g/dL (ref 3.5–5.2)
ALT: 22 U/L (ref 0–35)
AST: 20 U/L (ref 0–37)
Alkaline Phosphatase: 49 U/L (ref 39–117)
BILIRUBIN TOTAL: 0.7 mg/dL (ref 0.3–1.2)
BUN: 17 mg/dL (ref 6–23)
CALCIUM: 9.2 mg/dL (ref 8.4–10.5)
CO2: 30 meq/L (ref 19–32)
Chloride: 101 mEq/L (ref 96–112)
Creatinine, Ser: 0.9 mg/dL (ref 0.4–1.2)
GFR: 67.52 mL/min (ref >60.00)
GLUCOSE: 98 mg/dL (ref 70–99)
Potassium: 3.2 mEq/L — ABNORMAL LOW (ref 3.5–5.1)
SODIUM: 140 meq/L (ref 135–145)
TOTAL PROTEIN: 6.8 g/dL (ref 6.0–8.3)

## 2013-04-22 LAB — LIPID PANEL
CHOLESTEROL: 184 mg/dL (ref 0–200)
HDL: 65.6 mg/dL (ref >39.00)
LDL Cholesterol: 92 mg/dL (ref 0–99)
Total CHOL/HDL Ratio: 3
Triglycerides: 130 mg/dL (ref 0.0–149.0)
VLDL: 26 mg/dL (ref 0.0–40.0)

## 2013-04-22 MED ORDER — BUPROPION HCL ER (XL) 150 MG PO TB24: ORAL_TABLET | ORAL | Status: DC

## 2013-04-22 MED ORDER — SIMVASTATIN 10 MG PO TABS: ORAL_TABLET | ORAL | Status: DC

## 2013-04-22 MED ORDER — OMEPRAZOLE 20 MG PO CPDR: DELAYED_RELEASE_CAPSULE | ORAL | Status: DC

## 2013-04-22 MED ORDER — HYDROCHLOROTHIAZIDE 25 MG PO TABS: ORAL_TABLET | ORAL | Status: DC

## 2013-04-22 NOTE — Assessment & Plan Note (Signed)
She is taking Zocor nightly now. Recheck lipid panel today. Orders Placed This Encounter  Procedures  . Comprehensive metabolic panel  . Lipid panel

## 2013-04-22 NOTE — Assessment & Plan Note (Signed)
Well controlled on prilosec 20mg daily. 

## 2013-04-22 NOTE — Patient Instructions (Signed)
Good to see you. We will call you with your lab results.   

## 2013-04-22 NOTE — Assessment & Plan Note (Signed)
Well controlled. No changes. Check renal function and electrolytes today.

## 2013-04-22 NOTE — Progress Notes (Signed)
53 yo pleasant female here for med refills.  HTN- stable on HCTZ 25 mg daily. Lab Results  Component Value Date   CREATININE 1.0 07/26/2012   Depression/anxiety- well controlled.  Only using very occasional xanax.  HLD- On Zocor 10 mg qhs.  Due for labs- elevated last year but had not been taking Zocor nightly. Lab Results  Component Value Date   CHOL 238* 07/26/2012   HDL 64.90 07/26/2012   LDLCALC 110* 06/21/2011   LDLDIRECT 152.8 07/26/2012   TRIG 107.0 07/26/2012   CHOLHDL 4 07/26/2012   GERD-  Takes 1 capsule of prilosec daily.  Colonoscopy UTD (2012).  Symptoms well controlled.    Patient Active Problem List   Diagnosis Date Noted  . GERD (gastroesophageal reflux disease) 04/22/2013  . Depression 09/07/2011  . Hypertension   . Hyperlipidemia    Past Medical History  Diagnosis Date  . Hypertension   . Hyperlipidemia    Past Surgical History  Procedure Laterality Date  . Knee surgery    . Tonsillectomy  2006   History  Substance Use Topics  . Smoking status: Never Smoker   . Smokeless tobacco: Not on file  . Alcohol Use: Not on file   Family History  Problem Relation Age of Onset  . Multiple sclerosis Mother   . Cancer Father    Allergies  Allergen Reactions  . Sulfa Antibiotics Rash   Current Outpatient Prescriptions on File Prior to Visit  Medication Sig Dispense Refill  . ALPRAZolam (XANAX) 0.5 MG tablet TAKE 1 TABLET BY MOUTH AT BEDTIME AS NEEDED  30 tablet  0  . Calcium Carbonate-Vit D-Min (CALCIUM 1200 PO) Take 1 tablet by mouth daily.        . Cranberry 500 MG CAPS Take one by mouth daily      . fish oil-omega-3 fatty acids 1000 MG capsule Take 1 g by mouth daily.        . mometasone (NASONEX) 50 MCG/ACT nasal spray One spray each nostril dailyOne spray each nostril daily  17 g  3  . nitrofurantoin, macrocrystal-monohydrate, (MACROBID) 100 MG capsule Take one by mouth daily as needed  30 capsule  3   No current facility-administered medications on  file prior to visit.    The PMH, PSH, Social History, Family History, Medications, and allergies have been reviewed in [facility], and have been updated if relevant.  ROS: See HPI   Physical exam BP 110/68  Pulse 66  Temp(Src) 97.9 F (36.6 C) (Oral)  Ht 5' 2.25" (1.581 m)  Wt 212 lb 8 oz (96.389 kg)  BMI 38.56 kg/m2  SpO2 98%  General:  Well-developed,well-nourished,in no acute distress; alert,appropriate and cooperative throughout examination Head:  normocephalic and atraumatic.   Eyes:  vision grossly intact, pupils equal, pupils round, and pupils reactive to light.   Ears:  R ear normal and L ear normal.   Nose:  no external deformity.   Mouth:  good dentition.   Neck:  No deformities, masses, or tenderness noted. Lungs:  Normal respiratory effort, chest expands symmetrically. Lungs are clear to auscultation, no crackles or wheezes. Heart:  Normal rate and regular rhythm. S1 and S2 normal without gallop, murmur, click, rub or other extra sounds. Msk:  No deformity or scoliosis noted of thoracic or lumbar spine.   Extremities:  No clubbing, cyanosis, edema, or deformity noted with normal full range of motion of all joints.   Neurologic:  alert & oriented X3 and gait normal.  Skin:  Intact without suspicious lesions or rashes Cervical Nodes:  No lymphadenopathy noted Axillary Nodes:  No palpable lymphadenopathy Psych:  Cognition and judgment appear intact. Alert and cooperative with normal attention span and concentration. No apparent delusions, illusions, hallucinations

## 2013-04-23 ENCOUNTER — Telehealth: Payer: Self-pay | Admitting: Family Medicine

## 2013-04-23 NOTE — Telephone Encounter (Signed)
Relevant patient education assigned to patient using Emmi. ° °

## 2013-04-25 ENCOUNTER — Encounter: Payer: Self-pay | Admitting: Family Medicine

## 2013-07-24 ENCOUNTER — Other Ambulatory Visit: Payer: Self-pay | Admitting: Family Medicine

## 2013-08-12 ENCOUNTER — Other Ambulatory Visit: Payer: Self-pay | Admitting: Family Medicine

## 2013-08-15 ENCOUNTER — Encounter: Payer: Self-pay | Admitting: Family Medicine

## 2013-08-15 ENCOUNTER — Ambulatory Visit (INDEPENDENT_AMBULATORY_CARE_PROVIDER_SITE_OTHER): Payer: BC Managed Care – PPO | Admitting: Family Medicine

## 2013-08-15 VITALS — BP 114/60 | HR 72 | Temp 98.3°F | Ht 62.25 in | Wt 212.5 lb

## 2013-08-15 DIAGNOSIS — E785 Hyperlipidemia, unspecified: Secondary | ICD-10-CM

## 2013-08-15 DIAGNOSIS — Z1231 Encounter for screening mammogram for malignant neoplasm of breast: Secondary | ICD-10-CM

## 2013-08-15 DIAGNOSIS — F329 Major depressive disorder, single episode, unspecified: Secondary | ICD-10-CM

## 2013-08-15 DIAGNOSIS — F3289 Other specified depressive episodes: Secondary | ICD-10-CM

## 2013-08-15 DIAGNOSIS — I10 Essential (primary) hypertension: Secondary | ICD-10-CM

## 2013-08-15 DIAGNOSIS — Z0000 Encounter for general adult medical examination without abnormal findings: Secondary | ICD-10-CM | POA: Insufficient documentation

## 2013-08-15 DIAGNOSIS — F32A Depression, unspecified: Secondary | ICD-10-CM

## 2013-08-15 DIAGNOSIS — K219 Gastro-esophageal reflux disease without esophagitis: Secondary | ICD-10-CM

## 2013-08-15 DIAGNOSIS — Z87440 Personal history of urinary (tract) infections: Secondary | ICD-10-CM | POA: Insufficient documentation

## 2013-08-15 LAB — CBC WITH DIFFERENTIAL/PLATELET
BASOS PCT: 0.6 % (ref 0.0–3.0)
Basophils Absolute: 0 10*3/uL (ref 0.0–0.1)
EOS PCT: 3.4 % (ref 0.0–5.0)
Eosinophils Absolute: 0.3 10*3/uL (ref 0.0–0.7)
HEMATOCRIT: 44.3 % (ref 36.0–46.0)
Hemoglobin: 15 g/dL (ref 12.0–15.0)
LYMPHS ABS: 3.3 10*3/uL (ref 0.7–4.0)
Lymphocytes Relative: 45 % (ref 12.0–46.0)
MCHC: 33.8 g/dL (ref 30.0–36.0)
MCV: 90.4 fl (ref 78.0–100.0)
MONO ABS: 0.5 10*3/uL (ref 0.1–1.0)
Monocytes Relative: 6.8 % (ref 3.0–12.0)
Neutro Abs: 3.3 10*3/uL (ref 1.4–7.7)
Neutrophils Relative %: 44.2 % (ref 43.0–77.0)
Platelets: 263 10*3/uL (ref 150.0–400.0)
RBC: 4.9 Mil/uL (ref 3.87–5.11)
RDW: 12.8 % (ref 11.5–15.5)
WBC: 7.4 10*3/uL (ref 4.0–10.5)

## 2013-08-15 LAB — COMPREHENSIVE METABOLIC PANEL
ALBUMIN: 3.7 g/dL (ref 3.5–5.2)
ALK PHOS: 50 U/L (ref 39–117)
ALT: 25 U/L (ref 0–35)
AST: 21 U/L (ref 0–37)
BILIRUBIN TOTAL: 0.7 mg/dL (ref 0.2–1.2)
BUN: 15 mg/dL (ref 6–23)
CO2: 29 mEq/L (ref 19–32)
Calcium: 9.4 mg/dL (ref 8.4–10.5)
Chloride: 103 mEq/L (ref 96–112)
Creatinine, Ser: 1 mg/dL (ref 0.4–1.2)
GFR: 63.45 mL/min (ref >60.00)
Glucose, Bld: 104 mg/dL — ABNORMAL HIGH (ref 70–99)
POTASSIUM: 3.4 meq/L — AB (ref 3.5–5.1)
SODIUM: 141 meq/L (ref 135–145)
Total Protein: 6.8 g/dL (ref 6.0–8.3)

## 2013-08-15 LAB — VITAMIN B12: VITAMIN B 12: 289 pg/mL (ref 211–911)

## 2013-08-15 LAB — LIPID PANEL
CHOL/HDL RATIO: 3
Cholesterol: 187 mg/dL (ref 0–200)
HDL: 67.8 mg/dL (ref >39.00)
LDL CALC: 98 mg/dL (ref 0–99)
NonHDL: 119.2
Triglycerides: 106 mg/dL (ref 0.0–149.0)
VLDL: 21.2 mg/dL (ref 0.0–40.0)

## 2013-08-15 LAB — TSH: TSH: 1.77 u[IU]/mL (ref 0.35–4.50)

## 2013-08-15 MED ORDER — ALPRAZOLAM 0.5 MG PO TABS: ORAL_TABLET | ORAL | Status: DC

## 2013-08-15 NOTE — Progress Notes (Signed)
Pre visit review using our clinic review tool, if applicable. No additional management support is needed unless otherwise documented below in the visit note. 

## 2013-08-15 NOTE — Progress Notes (Signed)
53 yo pleasant female here for CPX.  She has no complaints today.  Well woman- G2P2.   No h/o abnormal pap smears. Last pap smear was done by me on 07/26/12. LMP approximately 2 years ago.  No post menopausal bleeding. Mammogram 08/20/12.  UTD colonoscopy- approx 4 years ago, 5 year recall [facility]).  HLD- On Zocor 10 mg qhs. Lab Results  Component Value Date   CHOL 184 04/22/2013   HDL 65.60 04/22/2013   LDLCALC 92 04/22/2013   LDLDIRECT 152.8 07/26/2012   TRIG 130.0 04/22/2013   CHOLHDL 3 04/22/2013   Lab Results  Component Value Date   ALT 22 04/22/2013   AST 20 04/22/2013   ALKPHOS 49 04/22/2013   BILITOT 0.7 04/22/2013   No results found for this basename: TSH   Lab Results  Component Value Date   WBC 7.2 07/26/2012   HGB 15.3* 07/26/2012   HCT 44.5 07/26/2012   MCV 90.4 07/26/2012   PLT 273.0 07/26/2012   Anxiety- has been stable on Wellbutrin 150 mg daily.  Takes occasional xanax- last refilled in 10/2012.  H/o recurrent UTIs- takes cranberry caps and likes to to have rx for macrobid to take prn. Has not had a UTI in several months.   Patient Active Problem List   Diagnosis Date Noted  . Routine general medical examination at a health care facility 08/15/2013  . GERD (gastroesophageal reflux disease) 04/22/2013  . Depression 09/07/2011  . Hypertension   . Hyperlipidemia    Past Medical History  Diagnosis Date  . Hypertension   . Hyperlipidemia    Past Surgical History  Procedure Laterality Date  . Knee surgery    . Tonsillectomy  2006   History  Substance Use Topics  . Smoking status: Never Smoker   . Smokeless tobacco: Not on file  . Alcohol Use: Not on file   Family History  Problem Relation Age of Onset  . Multiple sclerosis Mother   . Cancer Father    Allergies  Allergen Reactions  . Sulfa Antibiotics Rash   Current Outpatient Prescriptions on File Prior to Visit  Medication Sig Dispense Refill  . ALPRAZolam (XANAX) 0.5 MG tablet TAKE  1 TABLET BY MOUTH AT BEDTIME AS NEEDED  30 tablet  0  . buPROPion (WELLBUTRIN XL) 150 MG 24 hr tablet TAKE 1 TABLET BY MOUTH EVERY MORNING  90 tablet  1  . Calcium Carbonate-Vit D-Min (CALCIUM 1200 PO) Take 1 tablet by mouth daily.        . Cranberry 500 MG CAPS Take one by mouth daily      . fish oil-omega-3 fatty acids 1000 MG capsule Take 1 g by mouth daily.        . hydrochlorothiazide (HYDRODIURIL) 25 MG tablet TAKE 1 TABLET (25 MG TOTAL) BY MOUTH DAILY.  90 tablet  1  . omeprazole (PRILOSEC) 20 MG capsule TAKE 2 CAPSULES BY MOUTH EVERY DAY  90 capsule  0  . simvastatin (ZOCOR) 10 MG tablet TAKE 1 TABLET BY MOUTH AT BEDTIME  90 tablet  0   No current facility-administered medications on file prior to visit.    The PMH, PSH, Social History, Family History, Medications, and allergies have been reviewed in [facility], and have been updated if relevant.  ROS: See HPI Patient reports no  vision/ hearing changes,anorexia, weight change, fever ,adenopathy, persistant / recurrent hoarseness, swallowing issues, chest pain, edema,persistant / recurrent cough, hemoptysis, dyspnea(rest, exertional, paroxysmal nocturnal), gastrointestinal  bleeding (melena, rectal bleeding),  abdominal pain, excessive heart burn, syncope, focal weakness, severe memory loss, concerning skin lesions, depression, anxiety, abnormal bruising/bleeding, major joint swelling, breast masses or abnormal vaginal bleeding.    Physical exam Pulse 72  Temp(Src) 98.3 F (36.8 C) (Oral)  Ht 5' 2.25" (1.581 m)  Wt 212 lb 8 oz (96.389 kg)  BMI 38.56 kg/m2  SpO2 97%  General:  Well-developed,well-nourished,in no acute distress; alert,appropriate and cooperative throughout examination Head:  normocephalic and atraumatic.   Eyes:  vision grossly intact, pupils equal, pupils round, and pupils reactive to light.   Ears:  R ear normal and L ear normal.   Nose:  no external deformity.   Mouth:  good dentition.   Neck:  No deformities,  masses, or tenderness noted. Breasts:  No mass, nodules, thickening, tenderness, bulging, retraction, inflamation, nipple discharge or skin changes noted.   Lungs:  Normal respiratory effort, chest expands symmetrically. Lungs are clear to auscultation, no crackles or wheezes. Heart:  Normal rate and regular rhythm. S1 and S2 normal without gallop, murmur, click, rub or other extra sounds. Abdomen:  Bowel sounds positive,abdomen soft and non-tender without masses, organomegaly or hernias noted. Msk:  No deformity or scoliosis noted of thoracic or lumbar spine.   Extremities:  No clubbing, cyanosis, edema, or deformity noted with normal full range of motion of all joints.   Neurologic:  alert & oriented X3 and gait normal.   Skin:  Intact without suspicious lesions or rashes Cervical Nodes:  No lymphadenopathy noted Axillary Nodes:  No palpable lymphadenopathy Psych:  Cognition and judgment appear intact. Alert and cooperative with normal attention span and concentration. No apparent delusions, illusions, hallucinations   Assessment and Plan:

## 2013-08-15 NOTE — Assessment & Plan Note (Signed)
Well controlled on current rx. No changes. 

## 2013-08-15 NOTE — Assessment & Plan Note (Signed)
Well controlled on current dose of Wellbutrin and very occasional prn xanax.

## 2013-08-15 NOTE — Patient Instructions (Addendum)
Great to see you. Please call to set up your mammogram.   We will call you with your lab results and you can view them online.  Please get [place] a copy of your colonoscopy from [facility] to scan into your chart.

## 2013-08-15 NOTE — Assessment & Plan Note (Signed)
Asked her to call me if she has symptoms. Macrobid rx not refilled today.

## 2013-08-15 NOTE — Assessment & Plan Note (Signed)
Well controlled on low dose zocor. No changes.

## 2013-08-15 NOTE — Assessment & Plan Note (Signed)
Reviewed preventive care protocols, scheduled due services, and updated immunizations Discussed nutrition, exercise, diet, and healthy lifestyle.  She will call to set up mammogram.  UTD pap smear and colonoscopy (I have asked her to get a copy of this for me).

## 2013-09-05 ENCOUNTER — Ambulatory Visit: Payer: Self-pay | Admitting: Family Medicine

## 2013-09-05 ENCOUNTER — Encounter: Payer: Self-pay | Admitting: Family Medicine

## 2013-10-29 ENCOUNTER — Other Ambulatory Visit: Payer: Self-pay | Admitting: *Deleted

## 2013-10-29 MED ORDER — OMEPRAZOLE 20 MG PO CPDR: DELAYED_RELEASE_CAPSULE | ORAL | Status: DC

## 2013-11-25 ENCOUNTER — Other Ambulatory Visit: Payer: Self-pay | Admitting: *Deleted

## 2013-11-25 MED ORDER — SIMVASTATIN 10 MG PO TABS: ORAL_TABLET | ORAL | Status: DC

## 2013-12-18 ENCOUNTER — Other Ambulatory Visit: Payer: Self-pay | Admitting: Family Medicine

## 2013-12-19 MED ORDER — ALPRAZOLAM 0.5 MG PO TABS: ORAL_TABLET | ORAL | Status: DC

## 2013-12-19 NOTE — Telephone Encounter (Signed)
Rx called in to requested pharmacy 

## 2013-12-19 NOTE — Telephone Encounter (Signed)
Pt requesting medication refill. Last f/u appt 08/2013-CPE. pls advise 

## 2014-01-20 ENCOUNTER — Other Ambulatory Visit: Payer: Self-pay | Admitting: Family Medicine

## 2014-01-26 ENCOUNTER — Other Ambulatory Visit: Payer: Self-pay | Admitting: Family Medicine

## 2014-02-26 ENCOUNTER — Other Ambulatory Visit: Payer: Self-pay | Admitting: Family Medicine

## 2014-02-27 ENCOUNTER — Encounter: Payer: Self-pay | Admitting: Family Medicine

## 2014-04-23 ENCOUNTER — Encounter: Payer: Self-pay | Admitting: Family Medicine

## 2014-04-23 MED ORDER — OMEPRAZOLE 20 MG PO CPDR: 20.0000 mg | DELAYED_RELEASE_CAPSULE | Freq: Every day | ORAL | Status: DC

## 2014-04-23 NOTE — Addendum Note (Signed)
Addended by: Modena Nunnery on: 04/23/2014 03:16 PM   Modules accepted: Orders

## 2014-04-23 NOTE — Telephone Encounter (Signed)
Is it ok to change as requested. Unsure as to why pt received 40mg ; states she only takes 20mg  daily versus BID

## 2014-04-23 NOTE — Telephone Encounter (Signed)
Is it ok to send in a new Rx with new strength and dosing, as indicated by pt?

## 2014-04-25 ENCOUNTER — Other Ambulatory Visit: Payer: Self-pay | Admitting: Family Medicine

## 2014-05-02 ENCOUNTER — Other Ambulatory Visit: Payer: Self-pay | Admitting: Family Medicine

## 2014-05-05 ENCOUNTER — Encounter: Payer: Self-pay | Admitting: Family Medicine

## 2014-05-07 ENCOUNTER — Ambulatory Visit: Payer: Self-pay | Admitting: Family Medicine

## 2014-05-08 ENCOUNTER — Other Ambulatory Visit: Payer: Self-pay | Admitting: Family Medicine

## 2014-05-08 ENCOUNTER — Ambulatory Visit: Payer: Self-pay | Admitting: Family Medicine

## 2014-05-08 ENCOUNTER — Other Ambulatory Visit (INDEPENDENT_AMBULATORY_CARE_PROVIDER_SITE_OTHER): Payer: BLUE CROSS/BLUE SHIELD

## 2014-05-08 DIAGNOSIS — I10 Essential (primary) hypertension: Secondary | ICD-10-CM

## 2014-05-08 DIAGNOSIS — E538 Deficiency of other specified B group vitamins: Secondary | ICD-10-CM

## 2014-05-08 DIAGNOSIS — E785 Hyperlipidemia, unspecified: Secondary | ICD-10-CM

## 2014-05-08 LAB — COMPREHENSIVE METABOLIC PANEL
ALK PHOS: 48 U/L (ref 39–117)
ALT: 23 U/L (ref 0–35)
AST: 19 U/L (ref 0–37)
Albumin: 4.1 g/dL (ref 3.5–5.2)
BILIRUBIN TOTAL: 0.5 mg/dL (ref 0.2–1.2)
BUN: 15 mg/dL (ref 6–23)
CO2: 30 meq/L (ref 19–32)
CREATININE: 0.95 mg/dL (ref 0.40–1.20)
Calcium: 9.6 mg/dL (ref 8.4–10.5)
Chloride: 102 mEq/L (ref 96–112)
GFR: 64.82 mL/min (ref >60.00)
Glucose, Bld: 98 mg/dL (ref 70–99)
Potassium: 3.5 mEq/L (ref 3.5–5.1)
SODIUM: 138 meq/L (ref 135–145)
Total Protein: 6.6 g/dL (ref 6.0–8.3)

## 2014-05-08 LAB — LIPID PANEL
CHOL/HDL RATIO: 3
Cholesterol: 187 mg/dL (ref 0–200)
HDL: 74.4 mg/dL (ref >39.00)
LDL CALC: 92 mg/dL (ref 0–99)
NonHDL: 112.6
Triglycerides: 105 mg/dL (ref 0.0–149.0)
VLDL: 21 mg/dL (ref 0.0–40.0)

## 2014-05-08 LAB — VITAMIN B12: VITAMIN B 12: 869 pg/mL (ref 211–911)

## 2014-05-08 NOTE — Addendum Note (Signed)
Addended by: Ellamae Sia on: 05/08/2014 02:24 PM   Modules accepted: Orders

## 2014-05-09 ENCOUNTER — Encounter: Payer: Self-pay | Admitting: Family Medicine

## 2014-05-09 MED ORDER — SIMVASTATIN 10 MG PO TABS: 10.0000 mg | ORAL_TABLET | Freq: Every day | ORAL | Status: DC

## 2014-08-19 ENCOUNTER — Other Ambulatory Visit: Payer: Self-pay | Admitting: Family Medicine

## 2014-08-23 ENCOUNTER — Other Ambulatory Visit: Payer: Self-pay | Admitting: Family Medicine

## 2014-09-02 ENCOUNTER — Other Ambulatory Visit: Payer: Self-pay | Admitting: Family Medicine

## 2014-09-15 ENCOUNTER — Other Ambulatory Visit: Payer: Self-pay | Admitting: Family Medicine

## 2014-09-18 ENCOUNTER — Ambulatory Visit (INDEPENDENT_AMBULATORY_CARE_PROVIDER_SITE_OTHER): Payer: BLUE CROSS/BLUE SHIELD | Admitting: Family Medicine

## 2014-09-18 ENCOUNTER — Encounter: Payer: Self-pay | Admitting: Family Medicine

## 2014-09-18 VITALS — BP 118/66 | HR 72 | Temp 97.8°F | Ht 62.5 in | Wt 213.5 lb

## 2014-09-18 DIAGNOSIS — E538 Deficiency of other specified B group vitamins: Secondary | ICD-10-CM

## 2014-09-18 DIAGNOSIS — Z1239 Encounter for other screening for malignant neoplasm of breast: Secondary | ICD-10-CM

## 2014-09-18 DIAGNOSIS — E785 Hyperlipidemia, unspecified: Secondary | ICD-10-CM | POA: Diagnosis not present

## 2014-09-18 DIAGNOSIS — Z23 Encounter for immunization: Secondary | ICD-10-CM | POA: Diagnosis not present

## 2014-09-18 DIAGNOSIS — Z0000 Encounter for general adult medical examination without abnormal findings: Secondary | ICD-10-CM | POA: Diagnosis not present

## 2014-09-18 DIAGNOSIS — F329 Major depressive disorder, single episode, unspecified: Secondary | ICD-10-CM | POA: Diagnosis not present

## 2014-09-18 DIAGNOSIS — I10 Essential (primary) hypertension: Secondary | ICD-10-CM | POA: Diagnosis not present

## 2014-09-18 DIAGNOSIS — B079 Viral wart, unspecified: Secondary | ICD-10-CM | POA: Diagnosis not present

## 2014-09-18 DIAGNOSIS — Z1211 Encounter for screening for malignant neoplasm of colon: Secondary | ICD-10-CM

## 2014-09-18 DIAGNOSIS — F32A Depression, unspecified: Secondary | ICD-10-CM

## 2014-09-18 LAB — COMPREHENSIVE METABOLIC PANEL
ALT: 22 U/L (ref 0–35)
AST: 16 U/L (ref 0–37)
Albumin: 4 g/dL (ref 3.5–5.2)
Alkaline Phosphatase: 49 U/L (ref 39–117)
BUN: 16 mg/dL (ref 6–23)
CO2: 34 mEq/L — ABNORMAL HIGH (ref 19–32)
Calcium: 9.5 mg/dL (ref 8.4–10.5)
Chloride: 101 mEq/L (ref 96–112)
Creatinine, Ser: 0.99 mg/dL (ref 0.40–1.20)
GFR: 61.72 mL/min (ref >60.00)
GLUCOSE: 103 mg/dL — AB (ref 70–99)
POTASSIUM: 3.7 meq/L (ref 3.5–5.1)
SODIUM: 142 meq/L (ref 135–145)
TOTAL PROTEIN: 6.6 g/dL (ref 6.0–8.3)
Total Bilirubin: 0.7 mg/dL (ref 0.2–1.2)

## 2014-09-18 LAB — LIPID PANEL
CHOL/HDL RATIO: 3
Cholesterol: 203 mg/dL — ABNORMAL HIGH (ref 0–200)
HDL: 71.7 mg/dL (ref >39.00)
LDL CALC: 108 mg/dL — AB (ref 0–99)
NONHDL: 131.11
Triglycerides: 118 mg/dL (ref 0.0–149.0)
VLDL: 23.6 mg/dL (ref 0.0–40.0)

## 2014-09-18 LAB — CBC WITH DIFFERENTIAL/PLATELET
Basophils Absolute: 0.1 10*3/uL (ref 0.0–0.1)
Basophils Relative: 0.9 % (ref 0.0–3.0)
Eosinophils Absolute: 0.2 10*3/uL (ref 0.0–0.7)
Eosinophils Relative: 2.8 % (ref 0.0–5.0)
HCT: 47 % — ABNORMAL HIGH (ref 36.0–46.0)
Hemoglobin: 16 g/dL — ABNORMAL HIGH (ref 12.0–15.0)
Lymphocytes Relative: 44.8 % (ref 12.0–46.0)
Lymphs Abs: 3.1 10*3/uL (ref 0.7–4.0)
MCHC: 34 g/dL (ref 30.0–36.0)
MCV: 89.7 fl (ref 78.0–100.0)
MONO ABS: 0.5 10*3/uL (ref 0.1–1.0)
Monocytes Relative: 7.7 % (ref 3.0–12.0)
NEUTROS PCT: 43.8 % (ref 43.0–77.0)
Neutro Abs: 3 10*3/uL (ref 1.4–7.7)
Platelets: 275 10*3/uL (ref 150.0–400.0)
RBC: 5.24 Mil/uL — AB (ref 3.87–5.11)
RDW: 12.9 % (ref 11.5–15.5)
WBC: 6.9 10*3/uL (ref 4.0–10.5)

## 2014-09-18 LAB — TSH: TSH: 2.69 u[IU]/mL (ref 0.35–4.50)

## 2014-09-18 MED ORDER — OMEPRAZOLE 20 MG PO CPDR: 20.0000 mg | DELAYED_RELEASE_CAPSULE | Freq: Every day | ORAL | Status: DC

## 2014-09-18 MED ORDER — BUPROPION HCL ER (XL) 150 MG PO TB24: 150.0000 mg | ORAL_TABLET | Freq: Every morning | ORAL | Status: DC

## 2014-09-18 MED ORDER — SIMVASTATIN 10 MG PO TABS: 10.0000 mg | ORAL_TABLET | Freq: Every day | ORAL | Status: DC

## 2014-09-18 MED ORDER — HYDROCHLOROTHIAZIDE 25 MG PO TABS: ORAL_TABLET | ORAL | Status: DC

## 2014-09-18 NOTE — Assessment & Plan Note (Signed)
Symptoms well controlled. No changes made to rxs today.

## 2014-09-18 NOTE — Assessment & Plan Note (Signed)
Reviewed preventive care protocols, scheduled due services, and updated immunizations Discussed nutrition, exercise, diet, and healthy lifestyle.  Influenza vaccine given today.  Orders Placed This Encounter  Procedures  . Fecal occult blood, imunochemical  . CBC with Differential/Platelet  . Comprehensive metabolic panel  . Lipid panel  . TSH  . Hepatitis C Antibody  . HIV antibody (with reflex)

## 2014-09-18 NOTE — Assessment & Plan Note (Signed)
Normotensive. No changes made to rxs today. 

## 2014-09-18 NOTE — Addendum Note (Signed)
Addended by: Tammi Sou on: 09/18/2014 08:02 AM   Modules accepted: Orders

## 2014-09-18 NOTE — Progress Notes (Signed)
54 yo pleasant female here for CPX and follow up of chronic medical conditions.  She has no complaints today.  Well woman- G2P2.   No h/o abnormal pap smears. Last pap smear was done by me on 07/26/12. LMP approximately 3 years ago.  No post menopausal bleeding. Mammogram 09/05/13  UTD colonoscopy- approx 5 years ago, 5 year recall [facility]).  HTN- on HCTZ 25 mg daily. Denies any CP, blurred vision, HA or SOB. Lab Results  Component Value Date   NA 138 05/08/2014   K 3.5 05/08/2014   CL 102 05/08/2014   CO2 30 05/08/2014   Lab Results  Component Value Date   CREATININE 0.95 05/08/2014     HLD- On Zocor 10 mg qhs. Lab Results  Component Value Date   CHOL 187 05/08/2014   HDL 74.40 05/08/2014   LDLCALC 92 05/08/2014   LDLDIRECT 152.8 07/26/2012   TRIG 105.0 05/08/2014   CHOLHDL 3 05/08/2014   Lab Results  Component Value Date   ALT 23 05/08/2014   AST 19 05/08/2014   ALKPHOS 48 05/08/2014   BILITOT 0.5 05/08/2014   Lab Results  Component Value Date   TSH 1.77 08/15/2013   Lab Results  Component Value Date   WBC 7.4 08/15/2013   HGB 15.0 08/15/2013   HCT 44.3 08/15/2013   MCV 90.4 08/15/2013   PLT 263.0 08/15/2013   Anxiety- has been stable on Wellbutrin 150 mg daily.  Takes occasional xanax- last refilled in 10/2012.  H/o recurrent UTIs- takes cranberry caps and likes to to have rx for macrobid to take prn. Has not had a UTI in several months.   Patient Active Problem List   Diagnosis Date Noted  . B12 deficiency 05/08/2014  . Routine general medical examination at a health care facility 08/15/2013  . History of recurrent UTIs 08/15/2013  . GERD (gastroesophageal reflux disease) 04/22/2013  . Depression 09/07/2011  . Hypertension   . Hyperlipidemia    Past Medical History  Diagnosis Date  . Hypertension   . Hyperlipidemia    Past Surgical History  Procedure Laterality Date  . Knee surgery    . Tonsillectomy  2006   Social History   Substance Use Topics  . Smoking status: Never Smoker   . Smokeless tobacco: None  . Alcohol Use: 0.0 oz/week    0 Standard drinks or equivalent per week     Comment: rare   Family History  Problem Relation Age of Onset  . Multiple sclerosis Mother   . Cancer Father    Allergies  Allergen Reactions  . Sulfa Antibiotics Rash   Current Outpatient Prescriptions on File Prior to Visit  Medication Sig Dispense Refill  . ALPRAZolam (XANAX) 0.5 MG tablet TAKE 1 TABLET BY MOUTH AT BEDTIME AS NEEDED 30 tablet 0  . buPROPion (WELLBUTRIN XL) 150 MG 24 hr tablet TAKE 1 TABLET BY MOUTH EVERY MORNING 30 tablet 0  . Calcium Carbonate-Vit D-Min (CALCIUM 1200 PO) Take 1 tablet by mouth daily.      . Cranberry 500 MG CAPS Take one by mouth daily    . fish oil-omega-3 fatty acids 1000 MG capsule Take 1 g by mouth daily.      . hydrochlorothiazide (HYDRODIURIL) 25 MG tablet TAKE 1 TABLET (25 MG TOTAL) BY MOUTH DAILY. 30 tablet 0  . omeprazole (PRILOSEC) 20 MG capsule Take 1 capsule (20 mg total) by mouth daily. 30 capsule 3  . simvastatin (ZOCOR) 10 MG tablet Take 1 tablet (  10 mg total) by mouth at bedtime. 30 tablet 5   No current facility-administered medications on file prior to visit.    The PMH, PSH, Social History, Family History, Medications, and allergies have been reviewed in [facility], and have been updated if relevant.  Review of Systems  Constitutional: Negative.   HENT: Negative.   Eyes: Negative.   Respiratory: Negative.   Cardiovascular: Negative.   Gastrointestinal: Negative.   Endocrine: Negative.   Genitourinary: Negative.   Musculoskeletal: Negative.   Skin: Negative.   Allergic/Immunologic: Negative.   Neurological: Negative.   Hematological: Negative.   Psychiatric/Behavioral: Negative.   All other systems reviewed and are negative.    Physical exam BP 118/66 mmHg  Pulse 72  Temp(Src) 97.8 F (36.6 C) (Oral)  Ht 5' 2.5" (1.588 m)  Wt 213 lb 8 oz (96.843 kg)  BMI  38.40 kg/m2  SpO2 96%  General:  Well-developed,well-nourished,in no acute distress; alert,appropriate and cooperative throughout examination Head:  normocephalic and atraumatic.   Eyes:  vision grossly intact, pupils equal, pupils round, and pupils reactive to light.   Ears:  R ear normal and L ear normal.   Nose:  no external deformity.   Mouth:  good dentition.   Neck:  No deformities, masses, or tenderness noted. Breasts:  No mass, nodules, thickening, tenderness, bulging, retraction, inflamation, nipple discharge or skin changes noted.   Lungs:  Normal respiratory effort, chest expands symmetrically. Lungs are clear to auscultation, no crackles or wheezes. Heart:  Normal rate and regular rhythm. S1 and S2 normal without gallop, murmur, click, rub or other extra sounds. Abdomen:  Bowel sounds positive,abdomen soft and non-tender without masses, organomegaly or hernias noted. Msk:  No deformity or scoliosis noted of thoracic or lumbar spine.   Extremities:  No clubbing, cyanosis, edema, or deformity noted with normal full range of motion of all joints.   Neurologic:  alert & oriented X3 and gait normal.   Skin: common wart upper left arm Cervical Nodes:  No lymphadenopathy noted Axillary Nodes:  No palpable lymphadenopathy Psych:  Cognition and judgment appear intact. Alert and cooperative with normal attention span and concentration. No apparent delusions, illusions, hallucinations

## 2014-09-18 NOTE — Patient Instructions (Addendum)
Good to see you. We will call you with your lab results.  Please call [facility] to find out about your colonoscopy.  Stop by to see Vaughan Basta on your way out.  Keep treated area clean and dry  Triple antibiotic ointment and band aid are ok until it heals  If not entirely gone or if it re- occurs please call and let me know

## 2014-09-18 NOTE — Assessment & Plan Note (Signed)
Due for labs. No changes made to rx today.

## 2014-09-18 NOTE — Assessment & Plan Note (Signed)
Liquid Nitrogen was applied; continue to see at intervals until resolved.

## 2014-09-18 NOTE — Addendum Note (Signed)
Addended by: Tammi Sou on: 09/18/2014 07:56 AM   Modules accepted: Orders

## 2014-09-18 NOTE — Progress Notes (Signed)
Pre visit review using our clinic review tool, if applicable. No additional management support is needed unless otherwise documented below in the visit note. 

## 2014-09-19 ENCOUNTER — Other Ambulatory Visit: Payer: Self-pay | Admitting: *Deleted

## 2014-09-19 ENCOUNTER — Encounter: Payer: Self-pay | Admitting: Family Medicine

## 2014-09-19 LAB — HEPATITIS C ANTIBODY: HCV AB: NEGATIVE

## 2014-09-19 LAB — HIV ANTIBODY (ROUTINE TESTING W REFLEX): HIV: NONREACTIVE

## 2014-09-19 MED ORDER — ALPRAZOLAM 0.5 MG PO TABS: ORAL_TABLET | ORAL | Status: DC

## 2014-09-19 NOTE — Telephone Encounter (Signed)
Refilled per Dr. Deborra Medina.  See patient e-mail dated 09/19/14.

## 2014-09-23 ENCOUNTER — Ambulatory Visit
Admission: RE | Admit: 2014-09-23 | Discharge: 2014-09-23 | Disposition: A | Discharge status: Home / Self Care | Payer: BLUE CROSS/BLUE SHIELD | Source: Ambulatory Visit | Attending: Family Medicine | Admitting: Family Medicine

## 2014-09-23 DIAGNOSIS — Z1239 Encounter for other screening for malignant neoplasm of breast: Secondary | ICD-10-CM

## 2014-09-23 DIAGNOSIS — Z1231 Encounter for screening mammogram for malignant neoplasm of breast: Secondary | ICD-10-CM | POA: Insufficient documentation

## 2014-10-19 ENCOUNTER — Other Ambulatory Visit: Payer: Self-pay | Admitting: Family Medicine

## 2015-03-11 ENCOUNTER — Other Ambulatory Visit: Payer: Self-pay | Admitting: Family Medicine

## 2015-03-20 ENCOUNTER — Ambulatory Visit: Payer: BLUE CROSS/BLUE SHIELD | Admitting: Family Medicine

## 2015-08-05 NOTE — Progress Notes (Signed)
08/06/2015 9:25 AM   Hannah Ferguson [birth year] [id number]  Referring provider: Lucille Passy, MD [address] [place],  [address]  Chief Complaint  Patient presents with  . Medication Refill    patient states saw Larene Beach in 2013 in [place]    HPI: Patient is a 55 year old Caucasian female who presents today requesting post coital prophylactic antibiotics.  Patient states she develops symptoms of an UTI 24 hours after having sex.  The symptoms abate after three capsules.   Her symptoms with a urinary tract infection consist of dysuria and frequency.  She denies gross hematuria, suprapubic pain, back pain, abdominal pain or flank pain.  She has not had any recent fevers, chills, nausea or vomiting.   She does not have a history of nephrolithiasis, GU surgery or GU trauma.   She is sexually active.  She is post menopausal.  She does not engage in anal sex.  She is not voiding before and after sex.   She denies constipation and/or diarrhea.    She does engage in good perineal hygiene.  She does not take tub baths.   She is drinking 10 oz of water daily.     PMH: Past Medical History:  Diagnosis Date  . Depression   . Heartburn   . Hyperlipidemia   . Hypertension     Surgical History: Past Surgical History:  Procedure Laterality Date  . KNEE SURGERY    . TONSILLECTOMY  2006    Home Medications:    Medication List       Accurate as of 08/06/15  9:25 AM. Always use your most recent med list.          ALPRAZolam 0.5 MG tablet Commonly known as:  XANAX TAKE 1 TABLET BY MOUTH AT BEDTIME AS NEEDED   buPROPion 150 MG 24 hr tablet Commonly known as:  WELLBUTRIN XL TAKE 1 BY MOUTH EVERY MORNING   CALCIUM 1200 PO Take 1 tablet by mouth daily.   Cranberry 500 MG Caps Take one by mouth daily   cyanocobalamin 500 MCG tablet Take 500 mcg by mouth daily.   fish oil-omega-3 fatty acids 1000 MG capsule Take 1 g by mouth daily.   hydrochlorothiazide 25  MG tablet Commonly known as:  HYDRODIURIL TAKE 1 BY MOUTH DAILY   mometasone 50 MCG/ACT nasal spray Commonly known as:  NASONEX Place 2 sprays into the nose daily as needed.   nitrofurantoin (macrocrystal-monohydrate) 100 MG capsule Commonly known as:  MACROBID Take three capsules for UTI symptoms that develop after sexual intercourse   omeprazole 20 MG capsule Commonly known as:  PRILOSEC TAKE 1 BY MOUTH DAILY   simvastatin 10 MG tablet Commonly known as:  ZOCOR TAKE 1 BY MOUTH AT BEDTIME       Allergies:  Allergies  Allergen Reactions  . Sulfa Antibiotics Rash    Family History: Family History  Problem Relation Age of Onset  . Multiple sclerosis Mother   . Cancer Father   . Breast cancer Maternal Grandmother 29  . Kidney disease Neg Hx   . Bladder Cancer Neg Hx     Social History:  reports that she has never smoked. She has never used smokeless tobacco. She reports that she drinks alcohol. She reports that she does not use drugs.  ROS: UROLOGY Frequent Urination?: No Hard to postpone urination?: No Burning/pain with urination?: No Get up at night to urinate?: No Leakage of urine?: No Urine stream starts and  stops?: Yes Trouble starting stream?: No Do you have to strain to urinate?: No Blood in urine?: No Urinary tract infection?: No Sexually transmitted disease?: No Injury to kidneys or bladder?: No Painful intercourse?: No Weak stream?: No Currently pregnant?: No Vaginal bleeding?: No Last menstrual period?: n  Gastrointestinal Nausea?: No Vomiting?: No Indigestion/heartburn?: No Diarrhea?: No Constipation?: No  Constitutional Fever: No Night sweats?: No Weight loss?: No Fatigue?: No  Skin Skin rash/lesions?: No Itching?: No  Eyes Blurred vision?: No Double vision?: No  Ears/Nose/Throat Sore throat?: No Sinus problems?: No  Hematologic/Lymphatic Swollen glands?: No Easy bruising?: No  Cardiovascular Leg swelling?: No Chest  pain?: No  Respiratory Cough?: No Shortness of breath?: No  Endocrine Excessive thirst?: No  Musculoskeletal Back pain?: No Joint pain?: No  Neurological Headaches?: No Dizziness?: No  Psychologic Depression?: Yes Anxiety?: No  Physical Exam: BP 127/83   Pulse 71   Ht 5\' 2"  (1.575 m)   Wt 216 lb 12.8 oz (98.3 kg)   BMI 39.65 kg/m   Constitutional: Well nourished. Alert and oriented, No acute distress. HEENT: [place] AT, moist mucus membranes. Trachea midline, no masses. Cardiovascular: No clubbing, cyanosis, or edema. Respiratory: Normal respiratory effort, no increased work of breathing. GI: Abdomen is soft, non tender, non distended, no abdominal masses. Liver and spleen not palpable.  No hernias appreciated.  Stool sample for occult testing is not indicated.   GU: No CVA tenderness.  No bladder fullness or masses.   Skin: No rashes, bruises or suspicious lesions. Lymph: No cervical or inguinal adenopathy. Neurologic: Grossly intact, no focal deficits, moving all 4 extremities. Psychiatric: Normal mood and affect.  Laboratory Data: Lab Results  Component Value Date   WBC 6.9 09/18/2014   HGB 16.0 (H) 09/18/2014   HCT 47.0 (H) 09/18/2014   MCV 89.7 09/18/2014   PLT 275.0 09/18/2014    Lab Results  Component Value Date   CREATININE 0.99 09/18/2014    Lab Results  Component Value Date   TSH 2.69 09/18/2014       Component Value Date/Time   CHOL 203 (H) 09/18/2014 0818   HDL 71.70 09/18/2014 0818   CHOLHDL 3 09/18/2014 0818   VLDL 23.6 09/18/2014 0818   LDLCALC 108 (H) 09/18/2014 0818    Lab Results  Component Value Date   AST 16 09/18/2014   Lab Results  Component Value Date   ALT 22 09/18/2014      Assessment & Plan:    1. Recurrent UTI:   Patient experiences UTI symptoms after intercourse.  Nitrofuration controls her symptoms.  Refill given.  Advised the patient to contact us with breakthrough infections.  Advised the patient to contact  the office for UTI's with high fevers or intractable pain.    Return in about 1 year (around 08/05/2016) for symptom recheck.  These notes generated with voice recognition software. I apologize for typographical errors.  Zara Council, [facility] [address] [place], [place] [address] [phone]

## 2015-08-06 ENCOUNTER — Ambulatory Visit (INDEPENDENT_AMBULATORY_CARE_PROVIDER_SITE_OTHER): Payer: BLUE CROSS/BLUE SHIELD | Admitting: Urology

## 2015-08-06 ENCOUNTER — Encounter: Payer: Self-pay | Admitting: Urology

## 2015-08-06 VITALS — BP 127/83 | HR 71 | Ht 62.0 in | Wt 216.8 lb

## 2015-08-06 DIAGNOSIS — N390 Urinary tract infection, site not specified: Secondary | ICD-10-CM | POA: Diagnosis not present

## 2015-08-06 MED ORDER — NITROFURANTOIN MONOHYD MACRO 100 MG PO CAPS: ORAL_CAPSULE | ORAL | 2 refills | Status: DC

## 2015-08-28 ENCOUNTER — Telehealth: Payer: Self-pay | Admitting: Gastroenterology

## 2015-08-28 DIAGNOSIS — Z86010 Personal history of colonic polyps: Secondary | ICD-10-CM | POA: Diagnosis not present

## 2015-08-28 NOTE — Telephone Encounter (Signed)
Colonoscopy at [facility]. Patient stated that she wants to start seeing Dr. Allen Norris

## 2015-08-31 NOTE — Telephone Encounter (Signed)
Called patient and left a voicemail 

## 2015-09-02 ENCOUNTER — Telehealth: Payer: Self-pay

## 2015-09-02 ENCOUNTER — Other Ambulatory Visit: Payer: Self-pay

## 2015-09-02 NOTE — Telephone Encounter (Signed)
Screening Colonoscopy Z12.11 [facility] [id number] Please pre cert

## 2015-09-02 NOTE — Telephone Encounter (Signed)
Gastroenterology Pre-Procedure Review  Request Date: 10/02/2015 Requesting Physician: Dr. Deborra Medina  PATIENT REVIEW QUESTIONS: The patient responded to the following health history questions as indicated:    1. Are you having any GI issues? no 2. Do you have a personal history of Polyps? yes (benign) 3. Do you have a family history of Colon Cancer or Polyps? no 4. Diabetes Mellitus? no 5. Joint replacements in the past 12 months?no 6. Major health problems in the past 3 months?no 7. Any artificial heart valves, MVP, or defibrillator?no    MEDICATIONS & ALLERGIES:    Patient reports the following regarding taking any anticoagulation/antiplatelet therapy:   Plavix, Coumadin, Eliquis, Xarelto, Lovenox, Pradaxa, Brilinta, or Effient? no Aspirin? no  Patient confirms/reports the following medications:  Current Outpatient Prescriptions  Medication Sig Dispense Refill  . buPROPion (WELLBUTRIN XL) 150 MG 24 hr tablet TAKE 1 BY MOUTH EVERY MORNING 90 tablet 1  . Calcium Carbonate-Vit D-Min (CALCIUM 1200 PO) Take 1 tablet by mouth daily.      . Cranberry 500 MG CAPS Take one by mouth daily    . cyanocobalamin 500 MCG tablet Take 500 mcg by mouth daily.    . fish oil-omega-3 fatty acids 1000 MG capsule Take 1 g by mouth daily.      . hydrochlorothiazide (HYDRODIURIL) 25 MG tablet TAKE 1 BY MOUTH DAILY 90 tablet 1  . mometasone (NASONEX) 50 MCG/ACT nasal spray Place 2 sprays into the nose daily as needed.    . nitrofurantoin, macrocrystal-monohydrate, (MACROBID) 100 MG capsule Take three capsules for UTI symptoms that develop after sexual intercourse 90 capsule 2  . omeprazole (PRILOSEC) 20 MG capsule TAKE 1 BY MOUTH DAILY 90 capsule 1  . Probiotic Product (PROBIOTIC-10 PO) Take by mouth.    . simvastatin (ZOCOR) 10 MG tablet TAKE 1 BY MOUTH AT BEDTIME 90 tablet 1   No current facility-administered medications for this visit.     Patient confirms/reports the following allergies:  Allergies   Allergen Reactions  . Sulfa Antibiotics Rash    No orders of the defined types were placed in this encounter.   AUTHORIZATION INFORMATION Primary Insurance: 1D#: Group #:  Secondary Insurance: 1D#: Group #:  SCHEDULE INFORMATION: Date: 10/02/2015 Time: Location: [facility]

## 2015-09-02 NOTE — Telephone Encounter (Signed)
Colonoscopy Scheduled 10/02/2015 [facility]

## 2015-09-15 ENCOUNTER — Other Ambulatory Visit: Payer: Self-pay | Admitting: Family Medicine

## 2015-09-28 ENCOUNTER — Encounter: Payer: Self-pay | Admitting: *Deleted

## 2015-10-02 ENCOUNTER — Ambulatory Visit: Payer: BLUE CROSS/BLUE SHIELD | Admitting: Anesthesiology

## 2015-10-02 ENCOUNTER — Encounter
Admission: RE | Disposition: A | Discharge status: Home / Self Care | Payer: Self-pay | Source: Ambulatory Visit | Attending: Gastroenterology

## 2015-10-02 ENCOUNTER — Ambulatory Visit
Admission: RE | Admit: 2015-10-02 | Discharge: 2015-10-02 | Disposition: A | Discharge status: Home / Self Care | Payer: BLUE CROSS/BLUE SHIELD | Source: Ambulatory Visit | Attending: Gastroenterology | Admitting: Gastroenterology

## 2015-10-02 DIAGNOSIS — Z1211 Encounter for screening for malignant neoplasm of colon: Secondary | ICD-10-CM | POA: Diagnosis not present

## 2015-10-02 DIAGNOSIS — Z6839 Body mass index (BMI) 39.0-39.9, adult: Secondary | ICD-10-CM | POA: Insufficient documentation

## 2015-10-02 DIAGNOSIS — K635 Polyp of colon: Secondary | ICD-10-CM | POA: Insufficient documentation

## 2015-10-02 DIAGNOSIS — Z79899 Other long term (current) drug therapy: Secondary | ICD-10-CM | POA: Diagnosis not present

## 2015-10-02 DIAGNOSIS — K640 First degree hemorrhoids: Secondary | ICD-10-CM | POA: Diagnosis not present

## 2015-10-02 DIAGNOSIS — I10 Essential (primary) hypertension: Secondary | ICD-10-CM | POA: Diagnosis not present

## 2015-10-02 DIAGNOSIS — K5730 Diverticulosis of large intestine without perforation or abscess without bleeding: Secondary | ICD-10-CM | POA: Diagnosis not present

## 2015-10-02 DIAGNOSIS — D124 Benign neoplasm of descending colon: Secondary | ICD-10-CM

## 2015-10-02 DIAGNOSIS — F329 Major depressive disorder, single episode, unspecified: Secondary | ICD-10-CM | POA: Insufficient documentation

## 2015-10-02 DIAGNOSIS — D125 Benign neoplasm of sigmoid colon: Secondary | ICD-10-CM

## 2015-10-02 DIAGNOSIS — K219 Gastro-esophageal reflux disease without esophagitis: Secondary | ICD-10-CM | POA: Insufficient documentation

## 2015-10-02 DIAGNOSIS — E785 Hyperlipidemia, unspecified: Secondary | ICD-10-CM | POA: Insufficient documentation

## 2015-10-02 DIAGNOSIS — Z860100 Personal history of colon polyps, unspecified: Secondary | ICD-10-CM

## 2015-10-02 HISTORY — DX: Gastro-esophageal reflux disease without esophagitis: K21.9

## 2015-10-02 HISTORY — PX: COLONOSCOPY WITH PROPOFOL: SHX5780

## 2015-10-02 HISTORY — DX: Presence of spectacles and contact lenses: Z97.3

## 2015-10-02 HISTORY — PX: POLYPECTOMY: SHX5525

## 2015-10-02 SURGERY — COLONOSCOPY WITH PROPOFOL
Anesthesia: Monitor Anesthesia Care | Wound class: Contaminated

## 2015-10-02 MED ORDER — PROPOFOL 10 MG/ML IV BOLUS
INTRAVENOUS | Status: DC | PRN
  Administered 2015-10-02 (×4): 50 mg via INTRAVENOUS

## 2015-10-02 MED ORDER — STERILE WATER FOR IRRIGATION IR SOLN
Status: DC | PRN
  Administered 2015-10-02: 10:00:00

## 2015-10-02 MED ORDER — LIDOCAINE HCL (CARDIAC) 20 MG/ML IV SOLN
INTRAVENOUS | Status: DC | PRN
  Administered 2015-10-02: 30 mg via INTRAVENOUS

## 2015-10-02 MED ORDER — LACTATED RINGERS IV SOLN
INTRAVENOUS | Status: DC
  Administered 2015-10-02: 10:00:00 via INTRAVENOUS

## 2015-10-02 SURGICAL SUPPLY — 23 items

## 2015-10-02 NOTE — Transfer of Care (Signed)
Immediate Anesthesia Transfer of Care Note  Patient: Hannah Ferguson  Procedure(s) Performed: Procedure(s): COLONOSCOPY WITH PROPOFOL (N/A) POLYPECTOMY  Patient Location: PACU  Anesthesia Type: MAC  Level of Consciousness: awake, alert  and patient cooperative  Airway and Oxygen Therapy: Patient Spontanous Breathing and Patient connected to supplemental oxygen  Post-op Assessment: Post-op Vital signs reviewed, Patient's Cardiovascular Status Stable, Respiratory Function Stable, Patent Airway and No signs of Nausea or vomiting  Post-op Vital Signs: Reviewed and stable  Complications: No apparent anesthesia complications

## 2015-10-02 NOTE — H&P (Signed)
Lucilla Lame, MD [address] [place],  [address] Phone: [phone] Fax : [phone]  Primary Care Physician:  Arnette Norris, MD Primary Gastroenterologist:  Dr. Allen Norris  Pre-Procedure History & Physical: HPI:  Hannah Ferguson is a 55 y.o. female is here for an colonoscopy.   Past Medical History:  Diagnosis Date  . Depression   . GERD (gastroesophageal reflux disease)   . Heartburn   . Hyperlipidemia   . Hypertension    Pt denies  . Wears contact lenses     Past Surgical History:  Procedure Laterality Date  . KNEE SURGERY    . TONSILLECTOMY  2006    Prior to Admission medications   Medication Sig Start Date End Date Taking? Authorizing Provider  buPROPion (WELLBUTRIN XL) 150 MG 24 hr tablet Take 1 tablet (150 mg total) by mouth daily. COMPLETE PHYSICAL EXAM REQUIRED FOR ADDITIONAL REFILLS 09/15/15  Yes Lucille Passy, MD  Calcium Carbonate-Vit D-Min (CALCIUM 1200 PO) Take 1 tablet by mouth daily.     Yes Historical Provider, MD  Cranberry 500 MG CAPS Take one by mouth daily   Yes Historical Provider, MD  cyanocobalamin 500 MCG tablet Take 500 mcg by mouth daily.   Yes Historical Provider, MD  fish oil-omega-3 fatty acids 1000 MG capsule Take 1 g by mouth daily.     Yes Historical Provider, MD  hydrochlorothiazide (HYDRODIURIL) 25 MG tablet Take 1 tablet (25 mg total) by mouth daily. COMPLETE PHYSICAL EXAM REQUIRED FOR ADDITIONAL REFILLS 09/15/15  Yes Lucille Passy, MD  mometasone (NASONEX) 50 MCG/ACT nasal spray Place 2 sprays into the nose daily as needed.   Yes Historical Provider, MD  nitrofurantoin, macrocrystal-monohydrate, (MACROBID) 100 MG capsule Take three capsules for UTI symptoms that develop after sexual intercourse 08/06/15  Yes Shannon A McGowan, PA-C  omeprazole (PRILOSEC) 20 MG capsule Take 1 capsule (20 mg total) by mouth daily. COMPLETE PHYSICAL EXAM REQUIRED FOR ADDITIONAL REFILLS 09/15/15  Yes Lucille Passy, MD  Probiotic Product (PROBIOTIC-10 PO)  Take by mouth.   Yes Historical Provider, MD  simvastatin (ZOCOR) 10 MG tablet TAKE 1 BY MOUTH AT BEDTIME 03/11/15  Yes Lucille Passy, MD    Allergies as of 09/02/2015 - Review Complete 09/02/2015  Allergen Reaction Noted  . Sulfa antibiotics Rash 06/09/2010    Family History  Problem Relation Age of Onset  . Multiple sclerosis Mother   . Cancer Father   . Breast cancer Maternal Grandmother 52  . Kidney disease Neg Hx   . Bladder Cancer Neg Hx     Social History   Social History  . Marital status: Married    Spouse name: N/A  . Number of children: N/A  . Years of education: N/A   Occupational History  . Not on file.   Social History Main Topics  . Smoking status: Never Smoker  . Smokeless tobacco: Never Used  . Alcohol use 1.2 oz/week    2 Glasses of wine per week     Comment:    . Drug use: No  . Sexual activity: Not on file   Other Topics Concern  . Not on file   Social History Narrative  . No narrative on file    Review of Systems: See HPI, otherwise negative ROS  Physical Exam: BP 132/83   Pulse 71   Temp 97.3 F (36.3 C) (Temporal)   Resp 16   Ht 5\' 2"  (1.575 m)   Wt 215 lb (97.5  kg)   SpO2 98%   BMI 39.32 kg/m  General:   Alert,  pleasant and cooperative in NAD Head:  Normocephalic and atraumatic. Neck:  Supple; no masses or thyromegaly. Lungs:  Clear throughout to auscultation.    Heart:  Regular rate and rhythm. Abdomen:  Soft, nontender and nondistended. Normal bowel sounds, without guarding, and without rebound.   Neurologic:  Alert and  oriented x4;  grossly normal neurologically.  Impression/Plan: Hannah Ferguson is here for an colonoscopy to be performed for history of colon polyps  Risks, benefits, limitations, and alternatives regarding  colonoscopy have been reviewed with the patient.  Questions have been answered.  All parties agreeable.   Lucilla Lame, MD  10/02/2015, 9:37 AM

## 2015-10-02 NOTE — Anesthesia Postprocedure Evaluation (Signed)
Anesthesia Post Note  Patient: Hannah Ferguson  Procedure(s) Performed: Procedure(s) (LRB): COLONOSCOPY WITH PROPOFOL (N/A) POLYPECTOMY  Patient location during evaluation: PACU Anesthesia Type: MAC Level of consciousness: awake and alert Pain management: pain level controlled Vital Signs Assessment: post-procedure vital signs reviewed and stable Respiratory status: spontaneous breathing, nonlabored ventilation, respiratory function stable and patient connected to nasal cannula oxygen Cardiovascular status: stable and blood pressure returned to baseline Anesthetic complications: no    Loreda Silverio

## 2015-10-02 NOTE — Op Note (Signed)
[facility] Gastroenterology Patient Name: Hannah Ferguson Procedure Date: 10/02/2015 10:03 AM MRN: [id number] Account #: [id number] Date of Birth: 1960-12-02 Admit Type: Outpatient Age: 55 Room: [facility] OR ROOM 01 Gender: Female Note Status: Finalized Procedure:            Colonoscopy Indications:          High risk colon cancer surveillance: Personal history                        of colonic polyps Providers:            Lucilla Lame MD, MD Referring MD:         Marciano Sequin. Deborra Medina (Referring MD) Medicines:            Propofol per Anesthesia Complications:        No immediate complications. Procedure:            Pre-Anesthesia Assessment:                       - Prior to the procedure, a History and Physical was                        performed, and patient medications and allergies were                        reviewed. The patient's tolerance of previous                        anesthesia was also reviewed. The risks and benefits of                        the procedure and the sedation options and risks were                        discussed with the patient. All questions were                        answered, and informed consent was obtained. Prior                        Anticoagulants: The patient has taken no previous                        anticoagulant or antiplatelet agents. ASA Grade                        Assessment: II - A patient with mild systemic disease.                        After reviewing the risks and benefits, the patient was                        deemed in satisfactory condition to undergo the                        procedure.                       After obtaining informed consent, the colonoscope was  passed under direct vision. Throughout the procedure,                        the patient's blood pressure, pulse, and oxygen                        saturations were monitored continuously. The was                        introduced  through the anus and advanced to the the                        cecum, identified by appendiceal orifice and ileocecal                        valve. The colonoscopy was performed without                        difficulty. The patient tolerated the procedure well.                        The quality of the bowel preparation was excellent. Findings:      The perianal and digital rectal examinations were normal.      A 3 mm polyp was found in the descending colon. The polyp was sessile.       The polyp was removed with a cold biopsy forceps. Resection and       retrieval were complete.      A 3 mm polyp was found in the sigmoid colon. The polyp was sessile. The       polyp was removed with a cold biopsy forceps. Resection and retrieval       were complete.      Multiple small-mouthed diverticula were found in the sigmoid colon.      Non-bleeding internal hemorrhoids were found during retroflexion. The       hemorrhoids were Grade I (internal hemorrhoids that do not prolapse). Impression:           - One 3 mm polyp in the descending colon, removed with                        a cold biopsy forceps. Resected and retrieved.                       - One 3 mm polyp in the sigmoid colon, removed with a                        cold biopsy forceps. Resected and retrieved.                       - Diverticulosis in the sigmoid colon.                       - Non-bleeding internal hemorrhoids. Recommendation:       - Discharge patient to home.                       - Resume previous diet.                       - Continue present medications.                       -  Await pathology results. Procedure Code(s):    --- Professional ---                       [phone], Colonoscopy, flexible; with biopsy, single or                        multiple Diagnosis Code(s):    --- Professional ---                       Z86.010, Personal history of colonic polyps                       D12.4, Benign neoplasm of descending  colon                       D12.5, Benign neoplasm of sigmoid colon CPT copyright 2016 American Medical Association. All rights reserved. The codes documented in this report are preliminary and upon coder review may  be revised to meet current compliance requirements. Lucilla Lame MD, MD 10/02/2015 10:20:04 AM This report has been signed electronically. Number of Addenda: 0 Note Initiated On: 10/02/2015 10:03 AM Scope Withdrawal Time: 0 hours 6 minutes 28 seconds  Total Procedure Duration: 0 hours 7 minutes 20 seconds       [facility]

## 2015-10-02 NOTE — Anesthesia Preprocedure Evaluation (Signed)
Anesthesia Evaluation  Patient identified by MRN, date of birth, ID band  Reviewed: NPO status   History of Anesthesia Complications Negative for: history of anesthetic complications  Airway Mallampati: II  TM Distance: >3 FB Neck ROM: full    Dental no notable dental hx.    Pulmonary neg pulmonary ROS,    Pulmonary exam normal        Cardiovascular Exercise Tolerance: Good hypertension, negative cardio ROS Normal cardiovascular exam     Neuro/Psych Depression negative neurological ROS     GI/Hepatic Neg liver ROS, GERD  Controlled,  Endo/Other  Morbid obesity (bmi=39)  Renal/GU negative Renal ROS  negative genitourinary   Musculoskeletal   Abdominal   Peds  Hematology negative hematology ROS (+)   Anesthesia Other Findings   Reproductive/Obstetrics                             Anesthesia Physical Anesthesia Plan  ASA: II  Anesthesia Plan: MAC   Post-op Pain Management:    Induction:   Airway Management Planned:   Additional Equipment:   Intra-op Plan:   Post-operative Plan:   Informed Consent: I have reviewed the patients History and Physical, chart, labs and discussed the procedure including the risks, benefits and alternatives for the proposed anesthesia with the patient or authorized representative who has indicated his/her understanding and acceptance.     Plan Discussed with: CRNA  Anesthesia Plan Comments:         Anesthesia Quick Evaluation

## 2015-10-02 NOTE — Discharge Instructions (Signed)

## 2015-10-05 ENCOUNTER — Encounter: Payer: Self-pay | Admitting: Gastroenterology

## 2015-10-06 ENCOUNTER — Other Ambulatory Visit: Payer: Self-pay | Admitting: Family Medicine

## 2015-10-06 ENCOUNTER — Other Ambulatory Visit (INDEPENDENT_AMBULATORY_CARE_PROVIDER_SITE_OTHER): Payer: BLUE CROSS/BLUE SHIELD

## 2015-10-06 DIAGNOSIS — Z0000 Encounter for general adult medical examination without abnormal findings: Secondary | ICD-10-CM

## 2015-10-06 DIAGNOSIS — E538 Deficiency of other specified B group vitamins: Secondary | ICD-10-CM

## 2015-10-06 DIAGNOSIS — F329 Major depressive disorder, single episode, unspecified: Secondary | ICD-10-CM

## 2015-10-06 DIAGNOSIS — F32A Depression, unspecified: Secondary | ICD-10-CM

## 2015-10-06 DIAGNOSIS — Z86010 Personal history of colonic polyps: Secondary | ICD-10-CM

## 2015-10-06 DIAGNOSIS — E785 Hyperlipidemia, unspecified: Secondary | ICD-10-CM

## 2015-10-06 DIAGNOSIS — K219 Gastro-esophageal reflux disease without esophagitis: Secondary | ICD-10-CM

## 2015-10-06 DIAGNOSIS — I10 Essential (primary) hypertension: Secondary | ICD-10-CM

## 2015-10-06 LAB — LIPID PANEL
Cholesterol: 186 mg/dL (ref 0–200)
HDL: 63.3 mg/dL (ref >39.00)
LDL Cholesterol: 91 mg/dL (ref 0–99)
NonHDL: 123.17
TRIGLYCERIDES: 163 mg/dL — AB (ref 0.0–149.0)
Total CHOL/HDL Ratio: 3
VLDL: 32.6 mg/dL (ref 0.0–40.0)

## 2015-10-06 LAB — CBC WITH DIFFERENTIAL/PLATELET
BASOS ABS: 0 10*3/uL (ref 0.0–0.1)
Basophils Relative: 0.6 % (ref 0.0–3.0)
Eosinophils Absolute: 0.2 10*3/uL (ref 0.0–0.7)
Eosinophils Relative: 2.8 % (ref 0.0–5.0)
HEMATOCRIT: 44.7 % (ref 36.0–46.0)
HEMOGLOBIN: 15.6 g/dL — AB (ref 12.0–15.0)
LYMPHS PCT: 39.3 % (ref 12.0–46.0)
Lymphs Abs: 3.1 10*3/uL (ref 0.7–4.0)
MCHC: 34.9 g/dL (ref 30.0–36.0)
MCV: 88.8 fl (ref 78.0–100.0)
MONOS PCT: 6.1 % (ref 3.0–12.0)
Monocytes Absolute: 0.5 10*3/uL (ref 0.1–1.0)
NEUTROS ABS: 4 10*3/uL (ref 1.4–7.7)
Neutrophils Relative %: 51.2 % (ref 43.0–77.0)
Platelets: 274 10*3/uL (ref 150.0–400.0)
RBC: 5.03 Mil/uL (ref 3.87–5.11)
RDW: 12.8 % (ref 11.5–15.5)
WBC: 7.8 10*3/uL (ref 4.0–10.5)

## 2015-10-06 LAB — COMPREHENSIVE METABOLIC PANEL
ALBUMIN: 3.8 g/dL (ref 3.5–5.2)
ALK PHOS: 44 U/L (ref 39–117)
ALT: 24 U/L (ref 0–35)
AST: 18 U/L (ref 0–37)
BILIRUBIN TOTAL: 0.6 mg/dL (ref 0.2–1.2)
BUN: 14 mg/dL (ref 6–23)
CALCIUM: 9.1 mg/dL (ref 8.4–10.5)
CO2: 33 mEq/L — ABNORMAL HIGH (ref 19–32)
Chloride: 102 mEq/L (ref 96–112)
Creatinine, Ser: 1.05 mg/dL (ref 0.40–1.20)
GFR: 57.45 mL/min — AB (ref >60.00)
Glucose, Bld: 108 mg/dL — ABNORMAL HIGH (ref 70–99)
Potassium: 3.3 mEq/L — ABNORMAL LOW (ref 3.5–5.1)
Sodium: 142 mEq/L (ref 135–145)
TOTAL PROTEIN: 6.9 g/dL (ref 6.0–8.3)

## 2015-10-06 LAB — TSH: TSH: 2.68 u[IU]/mL (ref 0.35–4.50)

## 2015-10-07 ENCOUNTER — Encounter: Payer: Self-pay | Admitting: Gastroenterology

## 2015-10-13 DIAGNOSIS — L918 Other hypertrophic disorders of the skin: Secondary | ICD-10-CM | POA: Diagnosis not present

## 2015-10-13 DIAGNOSIS — D2272 Melanocytic nevi of left lower limb, including hip: Secondary | ICD-10-CM | POA: Diagnosis not present

## 2015-10-13 DIAGNOSIS — L820 Inflamed seborrheic keratosis: Secondary | ICD-10-CM | POA: Diagnosis not present

## 2015-10-13 DIAGNOSIS — L821 Other seborrheic keratosis: Secondary | ICD-10-CM | POA: Diagnosis not present

## 2015-10-13 DIAGNOSIS — D225 Melanocytic nevi of trunk: Secondary | ICD-10-CM | POA: Diagnosis not present

## 2015-11-05 ENCOUNTER — Encounter: Payer: Self-pay | Admitting: Family Medicine

## 2015-11-05 ENCOUNTER — Ambulatory Visit (INDEPENDENT_AMBULATORY_CARE_PROVIDER_SITE_OTHER): Payer: BLUE CROSS/BLUE SHIELD | Admitting: Family Medicine

## 2015-11-05 ENCOUNTER — Other Ambulatory Visit: Payer: Self-pay | Admitting: Family Medicine

## 2015-11-05 ENCOUNTER — Other Ambulatory Visit (HOSPITAL_COMMUNITY)
Admission: RE | Admit: 2015-11-05 | Discharge: 2015-11-05 | Disposition: A | Discharge status: Home / Self Care | Payer: BLUE CROSS/BLUE SHIELD | Source: Ambulatory Visit | Attending: Family Medicine | Admitting: Family Medicine

## 2015-11-05 VITALS — BP 132/76 | HR 70 | Temp 97.9°F | Ht 62.0 in | Wt 215.5 lb

## 2015-11-05 DIAGNOSIS — Z87440 Personal history of urinary (tract) infections: Secondary | ICD-10-CM

## 2015-11-05 DIAGNOSIS — Z23 Encounter for immunization: Secondary | ICD-10-CM | POA: Diagnosis not present

## 2015-11-05 DIAGNOSIS — Z1151 Encounter for screening for human papillomavirus (HPV): Secondary | ICD-10-CM | POA: Diagnosis not present

## 2015-11-05 DIAGNOSIS — F320 Major depressive disorder, single episode, mild: Secondary | ICD-10-CM | POA: Diagnosis not present

## 2015-11-05 DIAGNOSIS — Z0000 Encounter for general adult medical examination without abnormal findings: Secondary | ICD-10-CM | POA: Diagnosis not present

## 2015-11-05 DIAGNOSIS — Z01419 Encounter for gynecological examination (general) (routine) without abnormal findings: Secondary | ICD-10-CM | POA: Diagnosis not present

## 2015-11-05 DIAGNOSIS — K2100 Gastro-esophageal reflux disease with esophagitis, without bleeding: Secondary | ICD-10-CM

## 2015-11-05 DIAGNOSIS — Z86010 Personal history of colonic polyps: Secondary | ICD-10-CM

## 2015-11-05 DIAGNOSIS — Z1231 Encounter for screening mammogram for malignant neoplasm of breast: Secondary | ICD-10-CM

## 2015-11-05 DIAGNOSIS — I10 Essential (primary) hypertension: Secondary | ICD-10-CM

## 2015-11-05 DIAGNOSIS — E7800 Pure hypercholesterolemia, unspecified: Secondary | ICD-10-CM

## 2015-11-05 MED ORDER — MOMETASONE FUROATE 50 MCG/ACT NA SUSP: 2.0000 | Freq: Every day | NASAL | 3 refills | Status: DC | PRN

## 2015-11-05 NOTE — Assessment & Plan Note (Signed)
Well controlled on current rx. No changes made today. 

## 2015-11-05 NOTE — Assessment & Plan Note (Signed)
Reviewed preventive care protocols, scheduled due services, and updated immunizations Discussed nutrition, exercise, diet, and healthy lifestyle.  

## 2015-11-05 NOTE — Assessment & Plan Note (Signed)
Well controlled on current rxs. No changes made today. 

## 2015-11-05 NOTE — Progress Notes (Signed)
55 yo pleasant female here for CPX and follow up of chronic medical conditions.   Well woman- G2P2.   No h/o abnormal pap smears. Last pap smear was done by me on 07/26/12. LMP approximately 4 years ago.  No post menopausal bleeding. Mammogram 09/23/14  UTD colonoscopy- 10/02/15  HTN- on HCTZ 25 mg daily. Denies any CP, blurred vision, HA or SOB. Lab Results  Component Value Date   NA 142 10/06/2015   K 3.3 (L) 10/06/2015   CL 102 10/06/2015   CO2 33 (H) 10/06/2015   Lab Results  Component Value Date   CREATININE 1.05 10/06/2015     HLD- On Zocor 10 mg qhs. Lab Results  Component Value Date   CHOL 186 10/06/2015   HDL 63.30 10/06/2015   LDLCALC 91 10/06/2015   LDLDIRECT 152.8 07/26/2012   TRIG 163.0 (H) 10/06/2015   CHOLHDL 3 10/06/2015   Lab Results  Component Value Date   ALT 24 10/06/2015   AST 18 10/06/2015   ALKPHOS 44 10/06/2015   BILITOT 0.6 10/06/2015   Lab Results  Component Value Date   TSH 2.68 10/06/2015   Lab Results  Component Value Date   WBC 7.8 10/06/2015   HGB 15.6 (H) 10/06/2015   HCT 44.7 10/06/2015   MCV 88.8 10/06/2015   PLT 274.0 10/06/2015   Anxiety- stable on Wellbutrin 150 mg daily and occasional xanax.  H/o recurrent UTIs- takes cranberry caps and likes to to have rx for macrobid to take prn. Has not had a UTI in several months.  Saw urology for this as well.  GERD- symptoms have been controlled with Prilosec 20 mg daily. Patient Active Problem List  Diagnosis  . Hypertension  . Hyperlipidemia  . Depression  . GERD (gastroesophageal reflux disease)  . Routine general medical examination at a health care facility  . History of recurrent UTIs  . B12 deficiency  . History of colonic polyps  . Benign neoplasm of descending colon  . Benign neoplasm of sigmoid colon   Past Medical History:  Diagnosis Date  . Depression   . GERD (gastroesophageal reflux disease)   . Heartburn   . Hyperlipidemia   . Hypertension    Pt  denies  . Wears contact lenses    Past Surgical History:  Procedure Laterality Date  . COLONOSCOPY WITH PROPOFOL N/A 10/02/2015   Procedure: COLONOSCOPY WITH PROPOFOL;  Surgeon: Lucilla Lame, MD;  Location: [facility];  Service: Endoscopy;  Laterality: N/A;  . KNEE SURGERY    . POLYPECTOMY  10/02/2015   Procedure: POLYPECTOMY;  Surgeon: Lucilla Lame, MD;  Location: [facility];  Service: Endoscopy;;  . TONSILLECTOMY  2006   Social History  Substance Use Topics  . Smoking status: Never Smoker  . Smokeless tobacco: Never Used  . Alcohol use 1.2 oz/week    2 Glasses of wine per week     Comment:     Family History  Problem Relation Age of Onset  . Multiple sclerosis Mother   . Cancer Father   . Breast cancer Maternal Grandmother 37  . Kidney disease Neg Hx   . Bladder Cancer Neg Hx    Allergies  Allergen Reactions  . Sulfa Antibiotics Rash   Current Outpatient Prescriptions on File Prior to Visit  Medication Sig Dispense Refill  . buPROPion (WELLBUTRIN XL) 150 MG 24 hr tablet Take 1 tablet (150 mg total) by mouth daily. COMPLETE PHYSICAL EXAM REQUIRED FOR ADDITIONAL REFILLS 90 tablet 0  .  Calcium Carbonate-Vit D-Min (CALCIUM 1200 PO) Take 1 tablet by mouth daily.      . Cranberry 500 MG CAPS Take one by mouth daily    . cyanocobalamin 500 MCG tablet Take 500 mcg by mouth daily.    . fish oil-omega-3 fatty acids 1000 MG capsule Take 1 g by mouth daily.      . hydrochlorothiazide (HYDRODIURIL) 25 MG tablet Take 1 tablet (25 mg total) by mouth daily. COMPLETE PHYSICAL EXAM REQUIRED FOR ADDITIONAL REFILLS 90 tablet 0  . nitrofurantoin, macrocrystal-monohydrate, (MACROBID) 100 MG capsule Take three capsules for UTI symptoms that develop after sexual intercourse 90 capsule 2  . omeprazole (PRILOSEC) 20 MG capsule Take 1 capsule (20 mg total) by mouth daily. COMPLETE PHYSICAL EXAM REQUIRED FOR ADDITIONAL REFILLS 90 capsule 0  . Probiotic Product (PROBIOTIC-10 PO) Take by  mouth.    . simvastatin (ZOCOR) 10 MG tablet TAKE 1 BY MOUTH AT BEDTIME 90 tablet 1   No current facility-administered medications on file prior to visit.     The PMH, PSH, Social History, Family History, Medications, and allergies have been reviewed in [facility], and have been updated if relevant.  Review of Systems  Constitutional: Negative.   HENT: Negative.   Eyes: Negative.   Respiratory: Negative.   Cardiovascular: Negative.   Gastrointestinal: Negative.   Endocrine: Negative.   Genitourinary: Negative.   Musculoskeletal: Negative.   Skin: Negative.   Allergic/Immunologic: Negative.   Neurological: Negative.   Hematological: Negative.   Psychiatric/Behavioral: Negative.   All other systems reviewed and are negative.    Physical exam BP 132/76   Pulse 70   Temp 97.9 F (36.6 C) (Oral)   Ht 5\' 2"  (1.575 m)   Wt 215 lb 8 oz (97.8 kg)   SpO2 96%   BMI 39.42 kg/m    General:  Well-developed,well-nourished,in no acute distress; alert,appropriate and cooperative throughout examination Head:  normocephalic and atraumatic.   Eyes:  vision grossly intact, pupils equal, pupils round, and pupils reactive to light.   Ears:  R ear normal and L ear normal.   Nose:  no external deformity.   Mouth:  good dentition.   Neck:  No deformities, masses, or tenderness noted. Breasts:  No mass, nodules, thickening, tenderness, bulging, retraction, inflamation, nipple discharge or skin changes noted.   Lungs:  Normal respiratory effort, chest expands symmetrically. Lungs are clear to auscultation, no crackles or wheezes. Heart:  Normal rate and regular rhythm. S1 and S2 normal without gallop, murmur, click, rub or other extra sounds. Abdomen:  Bowel sounds positive,abdomen soft and non-tender without masses, organomegaly or hernias noted. Rectal:  no external abnormalities.   Genitalia:  Pelvic Exam:        External: normal female genitalia without lesions or masses        Vagina: normal  without lesions or masses        Cervix: normal without lesions or masses        Adnexa: normal bimanual exam without masses or fullness        Uterus: normal by palpation        Pap smear: performed Msk:  No deformity or scoliosis noted of thoracic or lumbar spine.   Extremities:  No clubbing, cyanosis, edema, or deformity noted with normal full range of motion of all joints.   Neurologic:  alert & oriented X3 and gait normal.   Skin:  Intact without suspicious lesions or rashes Cervical Nodes:  No lymphadenopathy noted Axillary Nodes:  No palpable lymphadenopathy Psych:  Cognition and judgment appear intact. Alert and cooperative with normal attention span and concentration. No apparent delusions, illusions, hallucinations

## 2015-11-05 NOTE — Assessment & Plan Note (Signed)
Colonoscopy UTD. 

## 2015-11-05 NOTE — Assessment & Plan Note (Signed)
Symptoms have been quiet. No changes made today.

## 2015-11-05 NOTE — Addendum Note (Signed)
Addended by: Modena Nunnery on: 11/05/2015 08:01 AM   Modules accepted: Orders

## 2015-11-05 NOTE — Addendum Note (Signed)
Addended by: Modena Nunnery on: 11/05/2015 09:31 AM   Modules accepted: Orders

## 2015-11-05 NOTE — Progress Notes (Signed)
Pre visit review using our clinic review tool, if applicable. No additional management support is needed unless otherwise documented below in the visit note. 

## 2015-11-05 NOTE — Assessment & Plan Note (Signed)
Symptoms controlled with Prilosec 20 mg daily.

## 2015-11-05 NOTE — Assessment & Plan Note (Signed)
Well controlled. No changes made today. 

## 2015-11-05 NOTE — Patient Instructions (Signed)
Great to see you. Please schedule your mammogram.   

## 2015-11-06 LAB — CYTOLOGY - PAP
Diagnosis: NEGATIVE
HPV (WINDOPATH): NOT DETECTED

## 2015-11-23 ENCOUNTER — Ambulatory Visit
Admission: RE | Admit: 2015-11-23 | Discharge: 2015-11-23 | Disposition: A | Discharge status: Home / Self Care | Payer: BLUE CROSS/BLUE SHIELD | Source: Ambulatory Visit | Attending: Family Medicine | Admitting: Family Medicine

## 2015-11-23 DIAGNOSIS — R928 Other abnormal and inconclusive findings on diagnostic imaging of breast: Secondary | ICD-10-CM | POA: Insufficient documentation

## 2015-11-23 DIAGNOSIS — Z1231 Encounter for screening mammogram for malignant neoplasm of breast: Secondary | ICD-10-CM | POA: Diagnosis not present

## 2015-11-24 ENCOUNTER — Other Ambulatory Visit: Payer: Self-pay | Admitting: Family Medicine

## 2015-11-24 DIAGNOSIS — N6310 Unspecified lump in the right breast, unspecified quadrant: Secondary | ICD-10-CM

## 2015-12-15 ENCOUNTER — Encounter: Payer: Self-pay | Admitting: Family Medicine

## 2015-12-15 MED ORDER — ALPRAZOLAM 0.5 MG PO TABS: 0.5000 mg | ORAL_TABLET | Freq: Every evening | ORAL | 0 refills | Status: DC | PRN

## 2015-12-15 NOTE — Telephone Encounter (Signed)
Per chart, xanax was d/c and is no longer on medication list. pls advise

## 2015-12-15 NOTE — Telephone Encounter (Signed)
Rx called in to requested pharmacy 

## 2015-12-15 NOTE — Telephone Encounter (Signed)
This medication is no longer on pts medication list and was d/c on 08/2015. You will have to enter this as a new Rx as it is requiring dosing and instruction

## 2015-12-15 NOTE — Telephone Encounter (Signed)
Rx printed

## 2015-12-17 ENCOUNTER — Ambulatory Visit
Admission: RE | Admit: 2015-12-17 | Discharge: 2015-12-17 | Disposition: A | Discharge status: Home / Self Care | Payer: BLUE CROSS/BLUE SHIELD | Source: Ambulatory Visit | Attending: Family Medicine | Admitting: Family Medicine

## 2015-12-17 DIAGNOSIS — N6310 Unspecified lump in the right breast, unspecified quadrant: Secondary | ICD-10-CM

## 2015-12-17 DIAGNOSIS — R928 Other abnormal and inconclusive findings on diagnostic imaging of breast: Secondary | ICD-10-CM | POA: Diagnosis not present

## 2016-03-17 ENCOUNTER — Telehealth: Payer: Self-pay | Admitting: Family Medicine

## 2016-03-17 NOTE — Telephone Encounter (Signed)
Pt has appt scheduled with Dr Deborra Medina on 03/21/16 at 8 AM.

## 2016-03-17 NOTE — Telephone Encounter (Signed)
Patient Name: Hannah Ferguson  DOB: February 27, 1960    Initial Comment Caller states she thinks her BP is high. 135/99- Having headaches.   Nurse Assessment  Nurse: Raphael Gibney, RN, Vanita Ingles Date/Time (Eastern Time): 03/17/2016 8:34:39 AM  Confirm and document reason for call. If symptomatic, describe symptoms. ---Caller states BP is 136/99. Has headache. Headache is not severe. Takes HCTZ. BP was elevated at the dentist.  Does the patient have any new or worsening symptoms? ---Yes  Will a triage be completed? ---Yes  Related visit to physician within the last 2 weeks? ---No  Does the PT have any chronic conditions? (i.e. diabetes, asthma, etc.) ---No  Is this a behavioral health or substance abuse call? ---No     Guidelines    Guideline Title Affirmed Question Affirmed Notes  High Blood Pressure Wants doctor to measure BP    Final Disposition User   See PCP within 2 Maudry Diego, RN, Vanita Ingles    Comments  appt scheduled for 03/21/16 at 8 am with Dr. Arnette Norris   Referrals  REFERRED TO PCP OFFICE   Disagree/Comply: Comply

## 2016-03-21 ENCOUNTER — Ambulatory Visit (INDEPENDENT_AMBULATORY_CARE_PROVIDER_SITE_OTHER): Payer: BLUE CROSS/BLUE SHIELD | Admitting: Family Medicine

## 2016-03-21 ENCOUNTER — Encounter: Payer: Self-pay | Admitting: Family Medicine

## 2016-03-21 VITALS — BP 128/90 | HR 67 | Temp 98.1°F | Wt 217.0 lb

## 2016-03-21 DIAGNOSIS — I10 Essential (primary) hypertension: Secondary | ICD-10-CM | POA: Diagnosis not present

## 2016-03-21 MED ORDER — SIMVASTATIN 10 MG PO TABS: ORAL_TABLET | ORAL | 2 refills | Status: DC

## 2016-03-21 NOTE — Progress Notes (Signed)
Subjective:   Patient ID: Hannah Ferguson, female    DOB: Jun 30, 1960, 56 y.o.   MRN: [id number]  ALEINA BURGIO is a pleasant 56 y.o. year old female who presents to clinic today with Hypertension (She has been checking it at home. She says the highest 136/98, lowest 114/80. Was high at the dentist a few weeks ago. Only a few headaches.)  on 03/21/2016  HPI:   Martin Majestic to the dentist for a failing dental implant and BP was elevated. Since then, when she checks her BP has been intermittently elevated and has had a headache.  Denies blurred vision, CP or SOB. Lab Results  Component Value Date   CREATININE 1.05 10/06/2015    Current Outpatient Prescriptions on File Prior to Visit  Medication Sig Dispense Refill  . ALPRAZolam (XANAX) 0.5 MG tablet Take 1 tablet (0.5 mg total) by mouth at bedtime as needed for anxiety. 30 tablet 0  . buPROPion (WELLBUTRIN XL) 150 MG 24 hr tablet Take 1 tablet (150 mg total) by mouth daily. COMPLETE PHYSICAL EXAM REQUIRED FOR ADDITIONAL REFILLS 90 tablet 0  . Calcium Carbonate-Vit D-Min (CALCIUM 1200 PO) Take 1 tablet by mouth daily.      . Cranberry 500 MG CAPS Take one by mouth daily    . cyanocobalamin 500 MCG tablet Take 500 mcg by mouth daily.    . fish oil-omega-3 fatty acids 1000 MG capsule Take 1 g by mouth daily.      . hydrochlorothiazide (HYDRODIURIL) 25 MG tablet Take 1 tablet (25 mg total) by mouth daily. COMPLETE PHYSICAL EXAM REQUIRED FOR ADDITIONAL REFILLS 90 tablet 0  . mometasone (NASONEX) 50 MCG/ACT nasal spray Place 2 sprays into the nose daily as needed. 17 g 3  . nitrofurantoin, macrocrystal-monohydrate, (MACROBID) 100 MG capsule Take three capsules for UTI symptoms that develop after sexual intercourse 90 capsule 2  . omeprazole (PRILOSEC) 20 MG capsule Take 1 capsule (20 mg total) by mouth daily. COMPLETE PHYSICAL EXAM REQUIRED FOR ADDITIONAL REFILLS 90 capsule 0  . Probiotic Product (PROBIOTIC-10 PO) Take by mouth.    . simvastatin (ZOCOR)  10 MG tablet TAKE 1 BY MOUTH AT BEDTIME 90 tablet 1   No current facility-administered medications on file prior to visit.     Allergies  Allergen Reactions  . Sulfa Antibiotics Rash    Past Medical History:  Diagnosis Date  . Depression   . GERD (gastroesophageal reflux disease)   . Heartburn   . Hyperlipidemia   . Hypertension    Pt denies  . Wears contact lenses     Past Surgical History:  Procedure Laterality Date  . COLONOSCOPY WITH PROPOFOL N/A 10/02/2015   Procedure: COLONOSCOPY WITH PROPOFOL;  Surgeon: Lucilla Lame, MD;  Location: [place];  Service: Endoscopy;  Laterality: N/A;  . KNEE SURGERY    . POLYPECTOMY  10/02/2015   Procedure: POLYPECTOMY;  Surgeon: Lucilla Lame, MD;  Location: [place];  Service: Endoscopy;;  . TONSILLECTOMY  2006    Family History  Problem Relation Age of Onset  . Multiple sclerosis Mother   . Cancer Father   . Breast cancer Maternal Grandmother 83  . Kidney disease Neg Hx   . Bladder Cancer Neg Hx     Social History   Social History  . Marital status: Married    Spouse name: N/A  . Number of children: N/A  . Years of education: N/A   Occupational History  . Not on file.  Social History Main Topics  . Smoking status: Never Smoker  . Smokeless tobacco: Never Used  . Alcohol use 1.2 oz/week    2 Glasses of wine per week     Comment:    . Drug use: No  . Sexual activity: Not on file   Other Topics Concern  . Not on file   Social History Narrative  . No narrative on file  The PMH, PSH, Social History, Family History, Medications, and allergies have been reviewed in [facility], and have been updated if relevant.  Review of Systems  Constitutional: Negative.   Eyes: Negative.   Respiratory: Negative.   Gastrointestinal: Negative.   Endocrine: Negative.   Genitourinary: Negative.   Musculoskeletal: Negative.   Allergic/Immunologic: Negative.   Neurological: Positive for headaches. Negative for  dizziness, tremors, seizures, syncope, facial asymmetry, speech difficulty, weakness, light-headedness and numbness.  Hematological: Negative.   Psychiatric/Behavioral: Negative.   All other systems reviewed and are negative.      Objective:    BP 128/90 (BP Location: Left Arm, Patient Position: Sitting, Cuff Size: Large)   Pulse 67   Temp 98.1 F (36.7 C) (Oral)   Wt 217 lb (98.4 kg)   SpO2 97%   BMI 39.69 kg/m    Physical Exam  Constitutional: She is oriented to person, place, and time. She appears well-developed and well-nourished. No distress.  HENT:  Head: Normocephalic and atraumatic.  Eyes: Conjunctivae are normal.  Cardiovascular: Normal rate.   Pulmonary/Chest: Effort normal.  Musculoskeletal: Normal range of motion. She exhibits no edema.  Neurological: She is alert and oriented to person, place, and time. No cranial nerve deficit.  Skin: Skin is warm and dry. She is not diaphoretic.  Psychiatric: She has a normal mood and affect. Her behavior is normal. Judgment and thought content normal.  Nursing note and vitals reviewed.         Assessment & Plan:   No diagnosis found. No Follow-up on file.

## 2016-03-21 NOTE — Progress Notes (Signed)
Pre visit review using our clinic review tool, if applicable. No additional management support is needed unless otherwise documented below in the visit note. 

## 2016-03-21 NOTE — Assessment & Plan Note (Signed)
Well controlled. Reassurance provided. No changes made to rxs today. Call or return to clinic prn if these symptoms worsen or fail to improve as anticipated. The patient indicates understanding of these issues and agrees with the plan.

## 2016-04-01 DIAGNOSIS — Z419 Encounter for procedure for purposes other than remedying health state, unspecified: Secondary | ICD-10-CM | POA: Diagnosis not present

## 2016-04-01 DIAGNOSIS — L821 Other seborrheic keratosis: Secondary | ICD-10-CM | POA: Diagnosis not present

## 2016-04-19 ENCOUNTER — Encounter: Payer: Self-pay | Admitting: Family Medicine

## 2016-04-19 ENCOUNTER — Other Ambulatory Visit: Payer: Self-pay | Admitting: *Deleted

## 2016-04-19 MED ORDER — ALPRAZOLAM 0.5 MG PO TABS: 0.5000 mg | ORAL_TABLET | Freq: Every evening | ORAL | 0 refills | Status: DC | PRN

## 2016-04-21 DIAGNOSIS — H20011 Primary iridocyclitis, right eye: Secondary | ICD-10-CM | POA: Diagnosis not present

## 2016-07-11 ENCOUNTER — Encounter: Payer: Self-pay | Admitting: Family Medicine

## 2016-07-11 ENCOUNTER — Other Ambulatory Visit: Payer: Self-pay | Admitting: Family Medicine

## 2016-07-11 DIAGNOSIS — N6310 Unspecified lump in the right breast, unspecified quadrant: Secondary | ICD-10-CM

## 2016-07-11 DIAGNOSIS — N6320 Unspecified lump in the left breast, unspecified quadrant: Secondary | ICD-10-CM

## 2016-07-22 NOTE — Telephone Encounter (Signed)
I spoke with patient. She will call Monday 7/16.

## 2016-08-05 ENCOUNTER — Ambulatory Visit: Payer: BLUE CROSS/BLUE SHIELD | Admitting: Urology

## 2016-09-09 ENCOUNTER — Ambulatory Visit
Admission: RE | Admit: 2016-09-09 | Discharge: 2016-09-09 | Disposition: A | Discharge status: Home / Self Care | Payer: BLUE CROSS/BLUE SHIELD | Source: Ambulatory Visit | Attending: Family Medicine | Admitting: Family Medicine

## 2016-09-09 DIAGNOSIS — N6001 Solitary cyst of right breast: Secondary | ICD-10-CM | POA: Insufficient documentation

## 2016-09-09 DIAGNOSIS — R928 Other abnormal and inconclusive findings on diagnostic imaging of breast: Secondary | ICD-10-CM | POA: Diagnosis not present

## 2016-09-09 DIAGNOSIS — N6310 Unspecified lump in the right breast, unspecified quadrant: Secondary | ICD-10-CM | POA: Diagnosis not present

## 2016-10-18 DIAGNOSIS — D225 Melanocytic nevi of trunk: Secondary | ICD-10-CM | POA: Diagnosis not present

## 2016-10-18 DIAGNOSIS — L821 Other seborrheic keratosis: Secondary | ICD-10-CM | POA: Diagnosis not present

## 2016-10-18 DIAGNOSIS — D2271 Melanocytic nevi of right lower limb, including hip: Secondary | ICD-10-CM | POA: Diagnosis not present

## 2016-10-18 DIAGNOSIS — D2262 Melanocytic nevi of left upper limb, including shoulder: Secondary | ICD-10-CM | POA: Diagnosis not present

## 2016-12-04 ENCOUNTER — Other Ambulatory Visit: Payer: Self-pay | Admitting: Family Medicine

## 2016-12-06 ENCOUNTER — Telehealth: Payer: Self-pay

## 2016-12-06 ENCOUNTER — Other Ambulatory Visit (INDEPENDENT_AMBULATORY_CARE_PROVIDER_SITE_OTHER): Payer: BLUE CROSS/BLUE SHIELD

## 2016-12-06 DIAGNOSIS — Z0000 Encounter for general adult medical examination without abnormal findings: Secondary | ICD-10-CM | POA: Diagnosis not present

## 2016-12-06 DIAGNOSIS — E785 Hyperlipidemia, unspecified: Secondary | ICD-10-CM

## 2016-12-06 DIAGNOSIS — E538 Deficiency of other specified B group vitamins: Secondary | ICD-10-CM | POA: Diagnosis not present

## 2016-12-06 LAB — LIPID PANEL
CHOL/HDL RATIO: 3
Cholesterol: 193 mg/dL (ref 0–200)
HDL: 66.5 mg/dL (ref >39.00)
LDL CALC: 100 mg/dL — AB (ref 0–99)
NonHDL: 126.3
TRIGLYCERIDES: 132 mg/dL (ref 0.0–149.0)
VLDL: 26.4 mg/dL (ref 0.0–40.0)

## 2016-12-06 LAB — COMPREHENSIVE METABOLIC PANEL
ALT: 29 U/L (ref 0–35)
AST: 21 U/L (ref 0–37)
Albumin: 3.9 g/dL (ref 3.5–5.2)
Alkaline Phosphatase: 42 U/L (ref 39–117)
BUN: 17 mg/dL (ref 6–23)
CALCIUM: 9.4 mg/dL (ref 8.4–10.5)
CO2: 32 meq/L (ref 19–32)
Chloride: 101 mEq/L (ref 96–112)
Creatinine, Ser: 1.01 mg/dL (ref 0.40–1.20)
GFR: 59.84 mL/min — AB (ref >60.00)
Glucose, Bld: 130 mg/dL — ABNORMAL HIGH (ref 70–99)
POTASSIUM: 3.4 meq/L — AB (ref 3.5–5.1)
Sodium: 141 mEq/L (ref 135–145)
Total Bilirubin: 0.7 mg/dL (ref 0.2–1.2)
Total Protein: 6.4 g/dL (ref 6.0–8.3)

## 2016-12-06 LAB — CBC WITH DIFFERENTIAL/PLATELET
BASOS ABS: 0 10*3/uL (ref 0.0–0.1)
Basophils Relative: 0.8 % (ref 0.0–3.0)
EOS ABS: 0.2 10*3/uL (ref 0.0–0.7)
Eosinophils Relative: 3.2 % (ref 0.0–5.0)
HCT: 46.2 % — ABNORMAL HIGH (ref 36.0–46.0)
Hemoglobin: 15.7 g/dL — ABNORMAL HIGH (ref 12.0–15.0)
LYMPHS ABS: 2.9 10*3/uL (ref 0.7–4.0)
Lymphocytes Relative: 48 % — ABNORMAL HIGH (ref 12.0–46.0)
MCHC: 34 g/dL (ref 30.0–36.0)
MCV: 90.7 fl (ref 78.0–100.0)
Monocytes Absolute: 0.4 10*3/uL (ref 0.1–1.0)
Monocytes Relative: 7 % (ref 3.0–12.0)
NEUTROS ABS: 2.4 10*3/uL (ref 1.4–7.7)
Neutrophils Relative %: 41 % — ABNORMAL LOW (ref 43.0–77.0)
PLATELETS: 264 10*3/uL (ref 150.0–400.0)
RBC: 5.09 Mil/uL (ref 3.87–5.11)
RDW: 12.8 % (ref 11.5–15.5)
WBC: 6 10*3/uL (ref 4.0–10.5)

## 2016-12-06 LAB — TSH: TSH: 2.77 u[IU]/mL (ref 0.35–4.50)

## 2016-12-06 LAB — VITAMIN B12

## 2016-12-06 NOTE — Telephone Encounter (Signed)
Future orders created for upcoming CPE/thx dmf

## 2016-12-13 ENCOUNTER — Encounter: Payer: Self-pay | Admitting: Family Medicine

## 2016-12-13 ENCOUNTER — Ambulatory Visit: Payer: BLUE CROSS/BLUE SHIELD | Admitting: Family Medicine

## 2016-12-13 VITALS — BP 126/88 | HR 70 | Temp 98.4°F | Ht 62.25 in | Wt 216.0 lb

## 2016-12-13 DIAGNOSIS — F320 Major depressive disorder, single episode, mild: Secondary | ICD-10-CM

## 2016-12-13 DIAGNOSIS — K2100 Gastro-esophageal reflux disease with esophagitis, without bleeding: Secondary | ICD-10-CM

## 2016-12-13 DIAGNOSIS — E7800 Pure hypercholesterolemia, unspecified: Secondary | ICD-10-CM | POA: Diagnosis not present

## 2016-12-13 DIAGNOSIS — Z01419 Encounter for gynecological examination (general) (routine) without abnormal findings: Secondary | ICD-10-CM | POA: Insufficient documentation

## 2016-12-13 DIAGNOSIS — I10 Essential (primary) hypertension: Secondary | ICD-10-CM | POA: Diagnosis not present

## 2016-12-13 DIAGNOSIS — Z0000 Encounter for general adult medical examination without abnormal findings: Secondary | ICD-10-CM

## 2016-12-13 DIAGNOSIS — Z23 Encounter for immunization: Secondary | ICD-10-CM

## 2016-12-13 MED ORDER — OMEPRAZOLE 20 MG PO CPDR: 20.0000 mg | DELAYED_RELEASE_CAPSULE | Freq: Every day | ORAL | 3 refills | Status: DC

## 2016-12-13 MED ORDER — SIMVASTATIN 10 MG PO TABS: ORAL_TABLET | ORAL | 3 refills | Status: DC

## 2016-12-13 MED ORDER — HYDROCHLOROTHIAZIDE 25 MG PO TABS: 25.0000 mg | ORAL_TABLET | Freq: Every day | ORAL | 3 refills | Status: DC

## 2016-12-13 NOTE — Assessment & Plan Note (Signed)
Reviewed preventive care protocols, scheduled due services, and updated immunizations Discussed nutrition, exercise, diet, and healthy lifestyle.  Influenza vaccine given today. 

## 2016-12-13 NOTE — Progress Notes (Signed)
Subjective:   Patient ID: Hannah Ferguson, female    DOB: 09-Sep-1960, 56 y.o.   MRN: [id number]  Hannah Ferguson is a pleasant 56 y.o. year old female who presents to clinic today with Annual Exam (Patient is here today for a CPE. Fasting labs completed on 11.27.2018. Last Mammogram completed 2nd week in July. Would like to receive flu vaccine today. She is needing refills of HCTZ & Omeprazole.)  on 12/13/2016  HPI:  Well woman- G2P2.   No h/o abnormal pap smears. Last pap smear was done by me on 11/06/15. LMP approximately years ago.  No post menopausal bleeding. Mammogram  09/09/16  UTD colonoscopy- 10/02/15  Saw her dermatologist 2 months ago.  HTN- on HCTZ 25 mg daily. Denies any CP, blurred vision, HA or SOB.  Lab Results  Component Value Date   CREATININE 1.01 12/06/2016   HLD- compliant with low dose zocor.  Lab Results  Component Value Date   CHOL 193 12/06/2016   HDL 66.50 12/06/2016   LDLCALC 100 (H) 12/06/2016   LDLDIRECT 152.8 07/26/2012   TRIG 132.0 12/06/2016   CHOLHDL 3 12/06/2016   Lab Results  Component Value Date   ALT 29 12/06/2016   AST 21 12/06/2016   ALKPHOS 42 12/06/2016   BILITOT 0.7 12/06/2016   Lab Results  Component Value Date   WBC 6.0 12/06/2016   HGB 15.7 (H) 12/06/2016   HCT 46.2 (H) 12/06/2016   MCV 90.7 12/06/2016   PLT 264.0 12/06/2016   Lab Results  Component Value Date   TSH 2.77 12/06/2016   Depression- symptoms have been well controlled with current dose of wellbutrin and xanax.  Current Outpatient Medications on File Prior to Visit  Medication Sig Dispense Refill  . ALPRAZolam (XANAX) 0.5 MG tablet Take 1 tablet (0.5 mg total) by mouth at bedtime as needed for anxiety. 30 tablet 0  . buPROPion (WELLBUTRIN XL) 150 MG 24 hr tablet TAKE 1 TABLET BY MOUTH EVERY MORNING 30 tablet 0  . Calcium Carbonate-Vit D-Min (CALCIUM 1200 PO) Take 1 tablet by mouth daily.      . Coenzyme Q10 (COQ-10 PO) Take by mouth.    . Cranberry 500  MG CAPS Take one by mouth daily    . cyanocobalamin 500 MCG tablet Take 500 mcg by mouth daily.    . fish oil-omega-3 fatty acids 1000 MG capsule Take 1 g by mouth daily.      . nitrofurantoin, macrocrystal-monohydrate, (MACROBID) 100 MG capsule Take three capsules for UTI symptoms that develop after sexual intercourse 90 capsule 2   No current facility-administered medications on file prior to visit.     Allergies  Allergen Reactions  . Sulfa Antibiotics Rash    Past Medical History:  Diagnosis Date  . Depression   . GERD (gastroesophageal reflux disease)   . Heartburn   . Hyperlipidemia   . Hypertension    Pt denies  . Wears contact lenses     Past Surgical History:  Procedure Laterality Date  . COLONOSCOPY WITH PROPOFOL N/A 10/02/2015   Procedure: COLONOSCOPY WITH PROPOFOL;  Surgeon: Lucilla Lame, MD;  Location: [place];  Service: Endoscopy;  Laterality: N/A;  . KNEE SURGERY    . POLYPECTOMY  10/02/2015   Procedure: POLYPECTOMY;  Surgeon: Lucilla Lame, MD;  Location: [place];  Service: Endoscopy;;  . TONSILLECTOMY  2006    Family History  Problem Relation Age of Onset  . Multiple sclerosis Mother   .  Cancer Father   . Breast cancer Maternal Grandmother 68  . Kidney disease Neg Hx   . Bladder Cancer Neg Hx     Social History   Socioeconomic History  . Marital status: Married    Spouse name: Not on file  . Number of children: Not on file  . Years of education: Not on file  . Highest education level: Not on file  Social Needs  . Financial resource strain: Not on file  . Food insecurity - worry: Not on file  . Food insecurity - inability: Not on file  . Transportation needs - medical: Not on file  . Transportation needs - non-medical: Not on file  Occupational History  . Not on file  Tobacco Use  . Smoking status: Never Smoker  . Smokeless tobacco: Never Used  Substance and Sexual Activity  . Alcohol use: Yes    Alcohol/week: 1.2 oz      Types: 2 Glasses of wine per week    Comment:    . Drug use: No  . Sexual activity: Not on file  Other Topics Concern  . Not on file  Social History Narrative  . Not on file   The PMH, PSH, Social History, Family History, Medications, and allergies have been reviewed in [facility], and have been updated if relevant.   Review of Systems  Constitutional: Negative.   HENT: Negative.   Eyes: Negative.   Respiratory: Negative.   Cardiovascular: Negative.   Gastrointestinal: Negative.   Endocrine: Negative.   Genitourinary: Negative.   Musculoskeletal: Negative.   Skin: Negative.   Neurological: Negative.   Hematological: Negative.   Psychiatric/Behavioral: Negative.   All other systems reviewed and are negative.      Objective:    BP 126/88 (BP Location: Left Arm, Patient Position: Sitting, Cuff Size: Normal)   Pulse 70   Temp 98.4 F (36.9 C) (Oral)   Ht 5' 2.25" (1.581 m)   Wt 216 lb (98 kg)   SpO2 97%   BMI 39.19 kg/m    Physical Exam   General:  Well-developed,well-nourished,in no acute distress; alert,appropriate and cooperative throughout examination Head:  normocephalic and atraumatic.   Eyes:  vision grossly intact, PERRL Ears:  R ear normal and L ear normal externally, TMs clear bilaterally Nose:  no external deformity.   Mouth:  good dentition.   Neck:  No deformities, masses, or tenderness noted. Breasts:  No mass, nodules, thickening, tenderness, bulging, retraction, inflamation, nipple discharge or skin changes noted.   Lungs:  Normal respiratory effort, chest expands symmetrically. Lungs are clear to auscultation, no crackles or wheezes. Heart:  Normal rate and regular rhythm. S1 and S2 normal without gallop, murmur, click, rub or other extra sounds. Abdomen:  Bowel sounds positive,abdomen soft and non-tender without masses, organomegaly or hernias noted. Msk:  No deformity or scoliosis noted of thoracic or lumbar spine.   Extremities:  No clubbing,  cyanosis, edema, or deformity noted with normal full range of motion of all joints.   Neurologic:  alert & oriented X3 and gait normal.   Skin:  Intact without suspicious lesions or rashes Cervical Nodes:  No lymphadenopathy noted Axillary Nodes:  No palpable lymphadenopathy Psych:  Cognition and judgment appear intact. Alert and cooperative with normal attention span and concentration. No apparent delusions, illusions, hallucinations       Assessment & Plan:   Pure hypercholesterolemia  Well woman exam  Essential hypertension  Current mild episode of major depressive disorder without prior episode (  [place])  Need for influenza vaccination - Plan: Flu Vaccine QUAD 6+ mos PF IM (Fluarix Quad PF) No Follow-up on file.

## 2016-12-13 NOTE — Assessment & Plan Note (Signed)
Much improved with low dose statin. No changes made today.

## 2016-12-13 NOTE — Assessment & Plan Note (Signed)
Well controlled. No changes made to current rxs.  

## 2016-12-13 NOTE — Assessment & Plan Note (Signed)
Takes PPI as needed.

## 2016-12-13 NOTE — Assessment & Plan Note (Signed)
  Symptoms controlled with current rxs. No changes made today.

## 2016-12-13 NOTE — Patient Instructions (Signed)
Happy Birthday and Happy Holidays!

## 2017-01-23 ENCOUNTER — Other Ambulatory Visit: Payer: Self-pay | Admitting: Family Medicine

## 2017-03-24 ENCOUNTER — Encounter: Payer: Self-pay | Admitting: Family Medicine

## 2017-07-02 ENCOUNTER — Other Ambulatory Visit: Payer: Self-pay | Admitting: Family Medicine

## 2017-07-31 ENCOUNTER — Other Ambulatory Visit: Payer: Self-pay | Admitting: Family Medicine

## 2017-08-01 ENCOUNTER — Other Ambulatory Visit: Payer: Self-pay

## 2017-08-01 MED ORDER — BUPROPION HCL ER (XL) 150 MG PO TB24: 150.0000 mg | ORAL_TABLET | Freq: Every morning | ORAL | 2 refills | Status: DC

## 2017-09-04 DIAGNOSIS — H1789 Other corneal scars and opacities: Secondary | ICD-10-CM | POA: Diagnosis not present

## 2017-10-19 DIAGNOSIS — D2261 Melanocytic nevi of right upper limb, including shoulder: Secondary | ICD-10-CM | POA: Diagnosis not present

## 2017-10-19 DIAGNOSIS — D2262 Melanocytic nevi of left upper limb, including shoulder: Secondary | ICD-10-CM | POA: Diagnosis not present

## 2017-10-19 DIAGNOSIS — L813 Cafe au lait spots: Secondary | ICD-10-CM | POA: Diagnosis not present

## 2017-10-19 DIAGNOSIS — Z419 Encounter for procedure for purposes other than remedying health state, unspecified: Secondary | ICD-10-CM | POA: Diagnosis not present

## 2017-10-19 DIAGNOSIS — D225 Melanocytic nevi of trunk: Secondary | ICD-10-CM | POA: Diagnosis not present

## 2017-10-25 ENCOUNTER — Other Ambulatory Visit: Payer: Self-pay | Admitting: Family Medicine

## 2017-11-02 DIAGNOSIS — Z23 Encounter for immunization: Secondary | ICD-10-CM | POA: Diagnosis not present

## 2017-12-19 NOTE — Progress Notes (Signed)
Subjective:   Patient ID: Hannah Ferguson, female    DOB: 01/03/1961, 57 y.o.   MRN: [id number]  Hannah Ferguson is a pleasant 57 y.o. year old female who presents to clinic today with Annual Exam (Patient is here today for a CPE.  Last PAP on 10.27.17 next due on 10.26.20.  Mammograms are completed at [facility] with last in chart being 8.30.2018 so this will need to be ordered as well as her first BMD scan.  She is currently fasting.)  on 12/21/2017  HPI:  Well woman-  Last pap smear was done by me on 11/06/15. No history of abnormal pap smears or post menopausal bleeding. Mammogram  09/09/16  UTD colonoscopy- 10/02/15  Health Maintenance  Topic Date Due  . MAMMOGRAM  07/20/2017  . PAP SMEAR-Modifier  11/05/2018  . COLONOSCOPY  10/01/2020  . TETANUS/TDAP  07/27/2022  . INFLUENZA VACCINE  Completed  . Hepatitis C Screening  Completed  . HIV Screening  Completed     HTN- on HCTZ 25 mg daily. Denies any CP, blurred vision, HA or SOB.  Lab Results  Component Value Date   CREATININE 1.01 12/06/2016   HLD- compliant with zocor 10 mg daily.  Due for labs today.  Lab Results  Component Value Date   CHOL 193 12/06/2016   HDL 66.50 12/06/2016   LDLCALC 100 (H) 12/06/2016   LDLDIRECT 152.8 07/26/2012   TRIG 132.0 12/06/2016   CHOLHDL 3 12/06/2016   Lab Results  Component Value Date   ALT 29 12/06/2016   AST 21 12/06/2016   ALKPHOS 42 12/06/2016   BILITOT 0.7 12/06/2016   Lab Results  Component Value Date   WBC 6.0 12/06/2016   HGB 15.7 (H) 12/06/2016   HCT 46.2 (H) 12/06/2016   MCV 90.7 12/06/2016   PLT 264.0 12/06/2016   Lab Results  Component Value Date   TSH 2.77 12/06/2016   Depression- symptoms have been well controlled with current dose of wellbutrin and rare as needed xanax.  Depression screen [facility] 2/9 12/21/2017 12/13/2016 09/18/2014  Decreased Interest 0 0 0  Down, Depressed, Hopeless 0 0 0  PHQ - 2 Score 0 0 0  Altered sleeping - 0 -  Tired,  decreased energy - 0 -  Change in appetite - 0 -  Feeling bad or failure about yourself  - 0 -  Trouble concentrating - 0 -  Moving slowly or fidgety/restless - 0 -  Suicidal thoughts - 0 -  PHQ-9 Score - 0 -      Current Outpatient Medications on File Prior to Visit  Medication Sig Dispense Refill  . ALPRAZolam (XANAX) 0.5 MG tablet Take 1 tablet (0.5 mg total) by mouth at bedtime as needed for anxiety. 30 tablet 0  . buPROPion (WELLBUTRIN XL) 150 MG 24 hr tablet Take 1 tablet (150 mg total) by mouth every morning. 90 tablet 2  . Calcium Carbonate-Vit D-Min (CALCIUM 1200 PO) Take 1 tablet by mouth daily.      . Coenzyme Q10 (COQ-10 PO) Take by mouth.    . Cranberry 500 MG CAPS Take one by mouth daily    . cyanocobalamin 500 MCG tablet Take 500 mcg by mouth daily.    . fish oil-omega-3 fatty acids 1000 MG capsule Take 1 g by mouth daily.      . hydrochlorothiazide (HYDRODIURIL) 25 MG tablet TAKE 1 TABLET BY MOUTH DAILY 90 tablet 0  . nitrofurantoin, macrocrystal-monohydrate, (MACROBID) 100 MG capsule Take three  capsules for UTI symptoms that develop after sexual intercourse 90 capsule 2  . omeprazole (PRILOSEC) 20 MG capsule TAKE 1 CAPSULE BY MOUTH DAILY 90 capsule 0  . simvastatin (ZOCOR) 10 MG tablet TAKE 1 TABLET BY MOUTH AT BEDTIME 90 tablet 3   No current facility-administered medications on file prior to visit.     Allergies  Allergen Reactions  . Sulfa Antibiotics Rash    Past Medical History:  Diagnosis Date  . Depression   . GERD (gastroesophageal reflux disease)   . Heartburn   . Hyperlipidemia   . Hypertension    Pt denies  . Wears contact lenses     Past Surgical History:  Procedure Laterality Date  . COLONOSCOPY WITH PROPOFOL N/A 10/02/2015   Procedure: COLONOSCOPY WITH PROPOFOL;  Surgeon: Lucilla Lame, MD;  Location: [place];  Service: Endoscopy;  Laterality: N/A;  . KNEE SURGERY    . POLYPECTOMY  10/02/2015   Procedure: POLYPECTOMY;  Surgeon:  Lucilla Lame, MD;  Location: [place];  Service: Endoscopy;;  . TONSILLECTOMY  2006    Family History  Problem Relation Age of Onset  . Multiple sclerosis Mother   . Cancer Father   . Breast cancer Maternal Grandmother 70  . Kidney disease Neg Hx   . Bladder Cancer Neg Hx     Social History   Socioeconomic History  . Marital status: Married    Spouse name: Not on file  . Number of children: Not on file  . Years of education: Not on file  . Highest education level: Not on file  Occupational History  . Not on file  Social Needs  . Financial resource strain: Not on file  . Food insecurity:    Worry: Not on file    Inability: Not on file  . Transportation needs:    Medical: Not on file    Non-medical: Not on file  Tobacco Use  . Smoking status: Never Smoker  . Smokeless tobacco: Never Used  Substance and Sexual Activity  . Alcohol use: Yes    Alcohol/week: 2.0 standard drinks    Types: 2 Glasses of wine per week    Comment:    . Drug use: No  . Sexual activity: Not on file  Lifestyle  . Physical activity:    Days per week: Not on file    Minutes per session: Not on file  . Stress: Not on file  Relationships  . Social connections:    Talks on phone: Not on file    Gets together: Not on file    Attends religious service: Not on file    Active member of club or organization: Not on file    Attends meetings of clubs or organizations: Not on file    Relationship status: Not on file  . Intimate partner violence:    Fear of current or ex partner: Not on file    Emotionally abused: Not on file    Physically abused: Not on file    Forced sexual activity: Not on file  Other Topics Concern  . Not on file  Social History Narrative  . Not on file   The PMH, PSH, Social History, Family History, Medications, and allergies have been reviewed in [facility], and have been updated if relevant.   Review of Systems  Constitutional: Negative.   HENT: Negative.   Eyes:  Negative.   Respiratory: Negative.   Cardiovascular: Negative.   Gastrointestinal: Negative.   Endocrine: Negative.  Genitourinary: Negative.   Musculoskeletal: Negative.   Skin: Negative.   Neurological: Negative.   Hematological: Negative.   Psychiatric/Behavioral: Negative.   All other systems reviewed and are negative.      Objective:    BP 136/86 (BP Location: Left Arm, Patient Position: Sitting, Cuff Size: Normal)   Pulse 70   Temp 98.7 F (37.1 C) (Oral)   Ht 5' 2.5" (1.588 m)   Wt 227 lb 6.4 oz (103.1 kg)   SpO2 95%   BMI 40.93 kg/m   Wt Readings from Last 3 Encounters:  12/21/17 227 lb 6.4 oz (103.1 kg)  12/13/16 216 lb (98 kg)  03/21/16 217 lb (98.4 kg)    Physical Exam   General:  Well-developed,well-nourished,in no acute distress; alert,appropriate and cooperative throughout examination Head:  normocephalic and atraumatic.   Eyes:  vision grossly intact, PERRL Ears:  R ear normal and L ear normal externally, TMs clear bilaterally Nose:  no external deformity.   Mouth:  good dentition.   Neck:  No deformities, masses, or tenderness noted. Breasts:  No mass, nodules, thickening, tenderness, bulging, retraction, inflamation, nipple discharge or skin changes noted.   Lungs:  Normal respiratory effort, chest expands symmetrically. Lungs are clear to auscultation, no crackles or wheezes. Heart:  Normal rate and regular rhythm. S1 and S2 normal without gallop, murmur, click, rub or other extra sounds. Abdomen:  Bowel sounds positive,abdomen soft and non-tender without masses, organomegaly or hernias noted. Msk:  No deformity or scoliosis noted of thoracic or lumbar spine.   Extremities:  No clubbing, cyanosis, edema, or deformity noted with normal full range of motion of all joints.   Neurologic:  alert & oriented X3 and gait normal.   Skin:  Intact without suspicious lesions or rashes Cervical Nodes:  No lymphadenopathy noted Axillary Nodes:  No palpable  lymphadenopathy Psych:  Cognition and judgment appear intact. Alert and cooperative with normal attention span and concentration. No apparent delusions, illusions, hallucinations         Assessment & Plan:   Essential hypertension - Plan: Comp Met (CMET), TSH  Pure hypercholesterolemia - Plan: Lipid Profile, TSH  Current mild episode of major depressive disorder without prior episode ([place]) - Plan: TSH  B12 deficiency - Plan: Vitamin B12  Well adult exam  Gastroesophageal reflux disease with esophagitis - Plan: CBC w/Diff, Vitamin B12 No follow-ups on file.

## 2017-12-21 ENCOUNTER — Ambulatory Visit (INDEPENDENT_AMBULATORY_CARE_PROVIDER_SITE_OTHER): Payer: BLUE CROSS/BLUE SHIELD | Admitting: Family Medicine

## 2017-12-21 ENCOUNTER — Encounter: Payer: Self-pay | Admitting: Family Medicine

## 2017-12-21 VITALS — BP 136/86 | HR 70 | Temp 98.7°F | Ht 62.5 in | Wt 227.4 lb

## 2017-12-21 DIAGNOSIS — K2100 Gastro-esophageal reflux disease with esophagitis, without bleeding: Secondary | ICD-10-CM

## 2017-12-21 DIAGNOSIS — Z0000 Encounter for general adult medical examination without abnormal findings: Secondary | ICD-10-CM | POA: Diagnosis not present

## 2017-12-21 DIAGNOSIS — E7800 Pure hypercholesterolemia, unspecified: Secondary | ICD-10-CM

## 2017-12-21 DIAGNOSIS — I10 Essential (primary) hypertension: Secondary | ICD-10-CM

## 2017-12-21 DIAGNOSIS — E538 Deficiency of other specified B group vitamins: Secondary | ICD-10-CM | POA: Diagnosis not present

## 2017-12-21 DIAGNOSIS — F320 Major depressive disorder, single episode, mild: Secondary | ICD-10-CM

## 2017-12-21 DIAGNOSIS — Z1239 Encounter for other screening for malignant neoplasm of breast: Secondary | ICD-10-CM

## 2017-12-21 LAB — VITAMIN B12: Vitamin B-12: 1277 pg/mL — ABNORMAL HIGH (ref 211–911)

## 2017-12-21 LAB — LIPID PANEL
Cholesterol: 208 mg/dL — ABNORMAL HIGH (ref 0–200)
HDL: 73 mg/dL (ref >39.00)
LDL Cholesterol: 100 mg/dL — ABNORMAL HIGH (ref 0–99)
NonHDL: 135.09
Total CHOL/HDL Ratio: 3
Triglycerides: 173 mg/dL — ABNORMAL HIGH (ref 0.0–149.0)
VLDL: 34.6 mg/dL (ref 0.0–40.0)

## 2017-12-21 LAB — COMPREHENSIVE METABOLIC PANEL
ALBUMIN: 4.4 g/dL (ref 3.5–5.2)
ALK PHOS: 43 U/L (ref 39–117)
ALT: 25 U/L (ref 0–35)
AST: 18 U/L (ref 0–37)
BUN: 13 mg/dL (ref 6–23)
CO2: 32 meq/L (ref 19–32)
CREATININE: 0.94 mg/dL (ref 0.40–1.20)
Calcium: 9.8 mg/dL (ref 8.4–10.5)
Chloride: 101 mEq/L (ref 96–112)
GFR: 64.78 mL/min (ref >60.00)
Glucose, Bld: 102 mg/dL — ABNORMAL HIGH (ref 70–99)
Potassium: 3.8 mEq/L (ref 3.5–5.1)
Sodium: 139 mEq/L (ref 135–145)
TOTAL PROTEIN: 7 g/dL (ref 6.0–8.3)
Total Bilirubin: 0.6 mg/dL (ref 0.2–1.2)

## 2017-12-21 LAB — CBC WITH DIFFERENTIAL/PLATELET
BASOS ABS: 0.1 10*3/uL (ref 0.0–0.1)
Basophils Relative: 1.1 % (ref 0.0–3.0)
EOS PCT: 3 % (ref 0.0–5.0)
Eosinophils Absolute: 0.2 10*3/uL (ref 0.0–0.7)
HCT: 46.7 % — ABNORMAL HIGH (ref 36.0–46.0)
HEMOGLOBIN: 16.1 g/dL — AB (ref 12.0–15.0)
Lymphocytes Relative: 47.5 % — ABNORMAL HIGH (ref 12.0–46.0)
Lymphs Abs: 3 10*3/uL (ref 0.7–4.0)
MCHC: 34.5 g/dL (ref 30.0–36.0)
MCV: 90.3 fl (ref 78.0–100.0)
Monocytes Absolute: 0.5 10*3/uL (ref 0.1–1.0)
Monocytes Relative: 7.7 % (ref 3.0–12.0)
NEUTROS ABS: 2.6 10*3/uL (ref 1.4–7.7)
NEUTROS PCT: 40.7 % — AB (ref 43.0–77.0)
Platelets: 271 10*3/uL (ref 150.0–400.0)
RBC: 5.17 Mil/uL — AB (ref 3.87–5.11)
RDW: 13.2 % (ref 11.5–15.5)
WBC: 6.3 10*3/uL (ref 4.0–10.5)

## 2017-12-21 LAB — TSH: TSH: 2.19 u[IU]/mL (ref 0.35–4.50)

## 2017-12-21 NOTE — Assessment & Plan Note (Signed)
Reviewed preventive care protocols, scheduled due services, and updated immunizations Discussed nutrition, exercise, diet, and healthy lifestyle.  Mammogram ordered- pt to scheduled.

## 2017-12-21 NOTE — Assessment & Plan Note (Signed)
Well controlled on current rxs- depression screen neg today. No changes made.

## 2017-12-21 NOTE — Assessment & Plan Note (Signed)
Continue current rx. Labs today.

## 2017-12-21 NOTE — Assessment & Plan Note (Signed)
Well controlled. No changes made today. 

## 2017-12-21 NOTE — Patient Instructions (Signed)
Great to see you. I will call you with your lab results from today and you can view them online.   Please call to schedule your mammogram.  Happy Holidays!

## 2017-12-22 ENCOUNTER — Other Ambulatory Visit: Payer: Self-pay | Admitting: Family Medicine

## 2017-12-22 DIAGNOSIS — D582 Other hemoglobinopathies: Secondary | ICD-10-CM

## 2018-01-01 ENCOUNTER — Telehealth: Payer: Self-pay | Admitting: Behavioral Health

## 2018-01-01 NOTE — Telephone Encounter (Signed)
Informed patient of lab results & provider's recommendations below. She voiced understanding and did not have any further questions or concerns prior to call ending.  Notes recorded by Lucille Passy, MD on 12/22/2017 at 10:19 AM EST Please call pt- over all, labs look great. Her hemoglobin does keep getting a bit higher every year- I am concerned that she may need to see a hematologist about this. It is nothing concerning- I am NOT worried about cancer or anything life threatening. I just would like for her to see a hematologist now that it is slightly above normal range. I will place referral. Her thyroid function, cholesterol, liver function, Vit B12, blood sugar all look great.

## 2018-01-17 ENCOUNTER — Inpatient Hospital Stay: Payer: BLUE CROSS/BLUE SHIELD

## 2018-01-17 ENCOUNTER — Encounter: Payer: Self-pay | Admitting: Hematology and Oncology

## 2018-01-17 ENCOUNTER — Other Ambulatory Visit: Payer: Self-pay

## 2018-01-17 ENCOUNTER — Inpatient Hospital Stay: Payer: BLUE CROSS/BLUE SHIELD | Attending: Hematology and Oncology | Admitting: Hematology and Oncology

## 2018-01-17 VITALS — BP 145/87 | HR 85 | Temp 96.9°F | Resp 18 | Ht 62.99 in | Wt 223.4 lb

## 2018-01-17 DIAGNOSIS — D751 Secondary polycythemia: Secondary | ICD-10-CM | POA: Insufficient documentation

## 2018-01-17 DIAGNOSIS — F329 Major depressive disorder, single episode, unspecified: Secondary | ICD-10-CM | POA: Diagnosis not present

## 2018-01-17 DIAGNOSIS — I10 Essential (primary) hypertension: Secondary | ICD-10-CM | POA: Diagnosis not present

## 2018-01-17 DIAGNOSIS — E538 Deficiency of other specified B group vitamins: Secondary | ICD-10-CM | POA: Diagnosis not present

## 2018-01-17 DIAGNOSIS — Z87891 Personal history of nicotine dependence: Secondary | ICD-10-CM | POA: Diagnosis not present

## 2018-01-17 DIAGNOSIS — Z79899 Other long term (current) drug therapy: Secondary | ICD-10-CM | POA: Insufficient documentation

## 2018-01-17 LAB — CBC WITH DIFFERENTIAL/PLATELET
ABS IMMATURE GRANULOCYTES: 0.01 10*3/uL (ref 0.00–0.07)
Basophils Absolute: 0.1 10*3/uL (ref 0.0–0.1)
Basophils Relative: 1 %
Eosinophils Absolute: 0.2 10*3/uL (ref 0.0–0.5)
Eosinophils Relative: 3 %
HEMATOCRIT: 48 % — AB (ref 36.0–46.0)
HEMOGLOBIN: 16.3 g/dL — AB (ref 12.0–15.0)
IMMATURE GRANULOCYTES: 0 %
Lymphocytes Relative: 43 %
Lymphs Abs: 2.5 10*3/uL (ref 0.7–4.0)
MCH: 30.4 pg (ref 26.0–34.0)
MCHC: 34 g/dL (ref 30.0–36.0)
MCV: 89.4 fL (ref 80.0–100.0)
MONOS PCT: 7 %
Monocytes Absolute: 0.4 10*3/uL (ref 0.1–1.0)
NEUTROS ABS: 2.8 10*3/uL (ref 1.7–7.7)
NEUTROS PCT: 46 %
NRBC: 0 % (ref 0.0–0.2)
PLATELETS: 280 10*3/uL (ref 150–400)
RBC: 5.37 MIL/uL — ABNORMAL HIGH (ref 3.87–5.11)
RDW: 12.8 % (ref 11.5–15.5)
WBC: 5.9 10*3/uL (ref 4.0–10.5)

## 2018-01-17 LAB — FERRITIN: FERRITIN: 48 ng/mL (ref 11–307)

## 2018-01-17 LAB — URINALYSIS, COMPLETE (UACMP) WITH MICROSCOPIC
Bilirubin Urine: NEGATIVE
GLUCOSE, UA: NEGATIVE mg/dL
Hgb urine dipstick: NEGATIVE
KETONES UR: NEGATIVE mg/dL
Nitrite: NEGATIVE
PROTEIN: NEGATIVE mg/dL
Specific Gravity, Urine: 1.02 (ref 1.005–1.030)
pH: 7 (ref 5.0–8.0)

## 2018-01-17 NOTE — Progress Notes (Signed)
[facility] day:  01/17/2018  Chief Complaint: Hannah Ferguson is a 58 y.o. 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 Arnette Norris for assessment and management.  HPI:  She is unsure of the chronicity of erythrocytosis, however notes that her pediatrician commented on lab values as a child. She was told in 12/2017, that her red blood cells were creeping up.  At that time, she was seen for a physical exam.  She denied any complaints.  Lab review: 06/21/2011:  Hematocrit 45.8, hemoglobin 15.4, MCV 91.4, platelets 298,000, WBC 7200 with an ANC of 3200.  ALC 3100. 07/26/2012:  Hematocrit 44.5, hemoglobin 15.3, MCV 90.4, platelets 273,000, WBC 7200 with an ANC of 3100.  ALC 3300. 08/15/2013:  Hematocrit 44.3, hemoglobin 15.0, MCV 90.4, platelets 263,000, WBC 7400 with an ANC of 3300.  ALC 3300. 09/18/2014:  Hematocrit 47.0, hemoglobin 16.0, MCV 89.7, platelets 275,000, WBC 6900 with an ANC of 3000.  ALC 3100. 12/06/2016:  Hematocrit 46.2, hemoglobin 15.7, MCV 90.7, platelets 264,000, WBC 6000 with an ANC of 2400.  ALC 2900. 12/21/2017:  Hematocrit 47.7, hemoglobin 16.1, MCV 90.3, platelets 271,000, WBC 6300 with an ANC of 2600.  ALC 3000.  Labs on 12/21/2017 included a normal CMP.  TSH was normal.  B12 was 1277. B12 was > 1500 on 12/06/2016.  Symptomatically, patient feels fine. Patient states, "I am pretty healthy".  She denies any heart or lung disease.  She denies excessive daytime sleepiness. There is no known OSAH.  Patient has been told that her snoring has increased as of late. She has carbon monoxide detectors in her home.   Patient denies bleeding; no hematochezia, melena, or gross hematuria. She does not have any shortness of breath at rest, chest pain, or palpitations. She has exertional dyspnea when taking stairs, however she attributes this to deconditioning and sedentary lifestyle.    Patient takes daily CoQ10, B12, calcium,  vitamin D, and a cranberry supplement.   Patient denies family history that is significant for any type hematologic disorders. Father died at age 79 of gastric cancer.    Past Medical History:  Diagnosis Date  . Depression   . GERD (gastroesophageal reflux disease)   . Heartburn   . Hyperlipidemia   . Hypertension    Pt denies  . Wears contact lenses     Past Surgical History:  Procedure Laterality Date  . COLONOSCOPY WITH PROPOFOL N/A 10/02/2015   Procedure: COLONOSCOPY WITH PROPOFOL;  Surgeon: Lucilla Lame, MD;  Location: [facility];  Service: Endoscopy;  Laterality: N/A;  . KNEE SURGERY    . POLYPECTOMY  10/02/2015   Procedure: POLYPECTOMY;  Surgeon: Lucilla Lame, MD;  Location: [facility];  Service: Endoscopy;;  . TONSILLECTOMY  2006    Family History  Problem Relation Age of Onset  . Multiple sclerosis Mother   . Cancer Father 8       stomach cancer died age 87  . Breast cancer Maternal Grandmother 45  . Drug abuse Brother   . Kidney disease Neg Hx   . Bladder Cancer Neg Hx     Social History:  reports that she quit smoking about 33 years ago. Her smoking use included cigarettes. She has a 1.00 pack-year smoking history. She has never used smokeless tobacco. She reports current alcohol use of about 2.0 standard drinks of alcohol per week. She reports that she does not use drugs.  She is a social drinker (  infrequent). 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 Patient denies known exposures to radiation on toxins. The patient is alone today.   Allergies:  Allergies  Allergen Reactions  . Sulfa Antibiotics Rash    Current Medications: Current Outpatient Medications  Medication Sig Dispense Refill  . aspirin EC 81 MG tablet Take 81 mg by mouth daily.    Marland Kitchen buPROPion (WELLBUTRIN XL) 150 MG 24 hr tablet Take 1 tablet (150 mg total) by mouth every morning. 90 tablet 2  . Calcium Carbonate-Vit D-Min (CALCIUM 1200 PO) Take 1 tablet by mouth daily.       . Coenzyme Q10 (COQ-10 PO) Take by mouth.    . Cranberry 500 MG CAPS Take one by mouth daily    . cyanocobalamin 500 MCG tablet Take 500 mcg by mouth daily.    . hydrochlorothiazide (HYDRODIURIL) 25 MG tablet TAKE 1 TABLET BY MOUTH DAILY 90 tablet 0  . omeprazole (PRILOSEC) 20 MG capsule TAKE 1 CAPSULE BY MOUTH DAILY 90 capsule 0  . simvastatin (ZOCOR) 10 MG tablet TAKE 1 TABLET BY MOUTH AT BEDTIME 90 tablet 3  . fish oil-omega-3 fatty acids 1000 MG capsule Take 1 g by mouth daily.       No current facility-administered medications for this visit.     Review of Systems:  GENERAL:  Feels "pretty healthy".  Active.  No fevers, sweats or weight loss. PERFORMANCE STATUS (ECOG):  0 HEENT:  No visual changes, runny nose, sore throat, mouth sores or tenderness. Lungs: Shortness of breath with exertion (stairs).  No cough.  No hemoptysis. Increased snoring. Cardiac:  No chest pain, palpitations, orthopnea, or PND. GI:  No nausea, vomiting, diarrhea, constipation, melena or hematochezia. GU:  No urgency, frequency, dysuria, or hematuria. Musculoskeletal:  No back pain.  No joint pain.  No muscle tenderness. Extremities:  No pain or swelling. Skin:  No rashes or skin changes. Neuro:  No headache, numbness or weakness, balance or coordination issues. Endocrine:  No diabetes, thyroid issues, hot flashes or night sweats. Psych:  No mood changes, depression or anxiety. Pain:  No focal pain. Review of systems:  All other systems reviewed and found to be negative.  Physical Exam: Blood pressure (!) 145/87, pulse 85, temperature (!) 96.9 F (36.1 C), temperature source Tympanic, resp. rate 18, height 5' 2.99" (1.6 m), weight 223 lb 7 oz (101.4 kg). GENERAL:  Well developed, well nourished, woman sitting comfortably in the exam room in no acute distress. MENTAL STATUS:  Alert and oriented to person, place and time. HEAD:  Brown hair.  Normocephalic, atraumatic, face symmetric, no Cushingoid  features. EYES:  Blue eyes.  Pupils equal round and reactive to light and accomodation.  No conjunctivitis or scleral icterus. ENT:  Oropharynx clear without lesion.  Tongue normal. Mucous membranes moist.  RESPIRATORY:  Clear to auscultation without rales, wheezes or rhonchi. CARDIOVASCULAR:  Regular rate and rhythm without murmur, rub or gallop. ABDOMEN:  Soft, non-tender, with active bowel sounds, and no hepatosplenomegaly.  No masses. SKIN:  No rashes, ulcers or lesions. EXTREMITIES: No edema, no skin discoloration or tenderness.  No palpable cords. LYMPH NODES: No palpable cervical, supraclavicular, axillary or inguinal adenopathy  NEUROLOGICAL: Unremarkable. PSYCH:  Appropriate.   No visits with results within 3 Day(s) from this visit.  Latest known visit with results is:  Office Visit on 12/21/2017  Component Date Value Ref Range Status  . Sodium 12/21/2017 139  135 - 145 mEq/L Final  . Potassium 12/21/2017 3.8  3.5 - 5.1 mEq/L Final  . Chloride 12/21/2017 101  96 - 112 mEq/L Final  . CO2 12/21/2017 32  19 - 32 mEq/L Final  . Glucose, Bld 12/21/2017 102* 70 - 99 mg/dL Final  . BUN 12/21/2017 13  6 - 23 mg/dL Final  . Creatinine, Ser 12/21/2017 0.94  0.40 - 1.20 mg/dL Final  . Total Bilirubin 12/21/2017 0.6  0.2 - 1.2 mg/dL Final  . Alkaline Phosphatase 12/21/2017 43  39 - 117 U/L Final  . AST 12/21/2017 18  0 - 37 U/L Final  . ALT 12/21/2017 25  0 - 35 U/L Final  . Total Protein 12/21/2017 7.0  6.0 - 8.3 g/dL Final  . Albumin 12/21/2017 4.4  3.5 - 5.2 g/dL Final  . Calcium 12/21/2017 9.8  8.4 - 10.5 mg/dL Final  . GFR 12/21/2017 64.78  >60.00 mL/min Final  . Cholesterol 12/21/2017 208* 0 - 200 mg/dL Final   ATP III Classification       Desirable:  < 200 mg/dL               Borderline High:  200 - 239 mg/dL          High:  > = 240 mg/dL  . Triglycerides 12/21/2017 173.0* 0.0 - 149.0 mg/dL Final   Normal:  <150 mg/dLBorderline High:  150 - 199 mg/dL  . HDL 12/21/2017 73.00   >39.00 mg/dL Final  . VLDL 12/21/2017 34.6  0.0 - 40.0 mg/dL Final  . LDL Cholesterol 12/21/2017 100* 0 - 99 mg/dL Final  . Total CHOL/HDL Ratio 12/21/2017 3   Final                  Men          Women1/2 Average Risk     3.4          3.3Average Risk          5.0          4.42X Average Risk          9.6          7.13X Average Risk          15.0          11.0                      . NonHDL 12/21/2017 135.09   Final   NOTE:  Non-HDL goal should be 30 mg/dL higher than patient's LDL goal (i.e. LDL goal of < 70 mg/dL, would have non-HDL goal of < 100 mg/dL)  . WBC 12/21/2017 6.3  4.0 - 10.5 K/uL Final  . RBC 12/21/2017 5.17* 3.87 - 5.11 Mil/uL Final  . Hemoglobin 12/21/2017 16.1* 12.0 - 15.0 g/dL Final  . HCT 12/21/2017 46.7* 36.0 - 46.0 % Final  . MCV 12/21/2017 90.3  78.0 - 100.0 fl Final  . MCHC 12/21/2017 34.5  30.0 - 36.0 g/dL Final  . RDW 12/21/2017 13.2  11.5 - 15.5 % Final  . Platelets 12/21/2017 271.0  150.0 - 400.0 K/uL Final  . Neutrophils Relative % 12/21/2017 40.7* 43.0 - 77.0 % Final  . Lymphocytes Relative 12/21/2017 47.5* 12.0 - 46.0 % Final  . Monocytes Relative 12/21/2017 7.7  3.0 - 12.0 % Final  . Eosinophils Relative 12/21/2017 3.0  0.0 - 5.0 % Final  . Basophils Relative 12/21/2017 1.1  0.0 - 3.0 % Final  . Neutro Abs 12/21/2017 2.6  1.4 - 7.7 K/uL Final  .  Lymphs Abs 12/21/2017 3.0  0.7 - 4.0 K/uL Final  . Monocytes Absolute 12/21/2017 0.5  0.1 - 1.0 K/uL Final  . Eosinophils Absolute 12/21/2017 0.2  0.0 - 0.7 K/uL Final  . Basophils Absolute 12/21/2017 0.1  0.0 - 0.1 K/uL Final  . TSH 12/21/2017 2.19  0.35 - 4.50 uIU/mL Final  . Vitamin B-12 12/21/2017 1,277* 211 - 911 pg/mL Final    Assessment:  Hannah Ferguson is a 58 y.o. 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   Hemoglobin has ranged between 15.7 - 16.1 in the past 4 years.  She denies any cardiopulmonary disease.  She denies any tobacco use. She may have sleep apnea (recent increasing snoring).  She has a history of B12  deficiency.  She has been on oral B12 for 3 years.  B12 was 289 on 08/15/2013,  > 1500 on 12/06/2016, and 1277 on 12/21/2017.  Symptomatically, she feels well.  Exam is unremarkable.  Plan: 1. Labs today:  CBC with diff, epo level, carbon monoxide level, ferritin, JAK2 with reflex, BCR-ABL, urinalysis. 2. Peripheral smear for pathologic review. 3. Erythrocytosis  Discuss primary vs. secondary etiologies  Primary:  myeloproliferative disorder or PV.  Secondary:  cardiopulmonary disease, smoking, testosterone use, sleep apnea, carbon monoxide exposure, RAS, exogenous epo, epo producing tumors, high affinity hemoglobin. 4. B12 deficiency  Discuss cutting back B12 with follow-up level to ensure level remains adequate. 5. RTC in 2 weeks for MD assessment, review of workup, and discussion regarding direction fo therapy.   Honor Loh, NP  01/17/2018, 8:58 AM   I saw and evaluated the patient, participating in the key portions of the service and reviewing pertinent diagnostic studies and records.  I reviewed the nurse practitioner's note and agree with the findings and the plan.  The assessment and plan were discussed with the patient.  Several questions were asked by the patient and answered.   Nolon Stalls, MD 01/17/2018,8:58 AM

## 2018-01-17 NOTE — Progress Notes (Signed)
Here for new pt evaluation.  

## 2018-01-18 ENCOUNTER — Ambulatory Visit
Admission: RE | Admit: 2018-01-18 | Discharge: 2018-01-18 | Disposition: A | Discharge status: Home / Self Care | Payer: BLUE CROSS/BLUE SHIELD | Source: Ambulatory Visit | Attending: Family Medicine | Admitting: Family Medicine

## 2018-01-18 DIAGNOSIS — Z1239 Encounter for other screening for malignant neoplasm of breast: Secondary | ICD-10-CM | POA: Diagnosis not present

## 2018-01-18 DIAGNOSIS — Z1231 Encounter for screening mammogram for malignant neoplasm of breast: Secondary | ICD-10-CM | POA: Diagnosis not present

## 2018-01-18 LAB — CARBON MONOXIDE, BLOOD (PERFORMED AT REF LAB): Carbon Monoxide, Blood: 4.4 % — ABNORMAL HIGH (ref 0.0–3.6)

## 2018-01-18 LAB — PATHOLOGIST SMEAR REVIEW

## 2018-01-18 LAB — ERYTHROPOIETIN: Erythropoietin: 13.5 m[IU]/mL (ref 2.6–18.5)

## 2018-01-22 ENCOUNTER — Other Ambulatory Visit: Payer: Self-pay

## 2018-01-22 DIAGNOSIS — K2100 Gastro-esophageal reflux disease with esophagitis, without bleeding: Secondary | ICD-10-CM

## 2018-01-22 DIAGNOSIS — I10 Essential (primary) hypertension: Secondary | ICD-10-CM

## 2018-01-22 MED ORDER — OMEPRAZOLE 20 MG PO CPDR: 20.0000 mg | DELAYED_RELEASE_CAPSULE | Freq: Every day | ORAL | 0 refills | Status: DC

## 2018-01-22 MED ORDER — HYDROCHLOROTHIAZIDE 25 MG PO TABS: 25.0000 mg | ORAL_TABLET | Freq: Every day | ORAL | 0 refills | Status: DC

## 2018-01-23 LAB — BCR-ABL1 FISH
CELLS COUNTED: 200
Cells Analyzed: 200

## 2018-01-26 ENCOUNTER — Other Ambulatory Visit: Payer: Self-pay

## 2018-01-26 DIAGNOSIS — K2100 Gastro-esophageal reflux disease with esophagitis, without bleeding: Secondary | ICD-10-CM

## 2018-01-26 LAB — JAK2 V617F, W REFLEX TO CALR/E12/MPL

## 2018-01-26 LAB — CALR + JAK2 E12-15 + MPL (REFLEXED)

## 2018-01-26 MED ORDER — OMEPRAZOLE 20 MG PO CPDR: 20.0000 mg | DELAYED_RELEASE_CAPSULE | Freq: Every day | ORAL | 0 refills | Status: DC

## 2018-01-31 ENCOUNTER — Encounter: Payer: Self-pay | Admitting: Hematology and Oncology

## 2018-01-31 ENCOUNTER — Inpatient Hospital Stay: Payer: BLUE CROSS/BLUE SHIELD | Admitting: Hematology and Oncology

## 2018-01-31 VITALS — BP 127/85 | HR 71 | Temp 98.3°F | Resp 16 | Wt 222.7 lb

## 2018-01-31 DIAGNOSIS — I10 Essential (primary) hypertension: Secondary | ICD-10-CM | POA: Diagnosis not present

## 2018-01-31 DIAGNOSIS — D751 Secondary polycythemia: Secondary | ICD-10-CM | POA: Diagnosis not present

## 2018-01-31 DIAGNOSIS — E538 Deficiency of other specified B group vitamins: Secondary | ICD-10-CM

## 2018-01-31 DIAGNOSIS — Z79899 Other long term (current) drug therapy: Secondary | ICD-10-CM | POA: Diagnosis not present

## 2018-01-31 DIAGNOSIS — Z87891 Personal history of nicotine dependence: Secondary | ICD-10-CM | POA: Diagnosis not present

## 2018-01-31 DIAGNOSIS — F329 Major depressive disorder, single episode, unspecified: Secondary | ICD-10-CM | POA: Diagnosis not present

## 2018-01-31 NOTE — Progress Notes (Signed)
[facility] day:  01/31/2018  Chief Complaint: Hannah Ferguson is a 58 y.o. 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  HPI:  The patient was last seen in the hematology clinic on 01/17/2018 for initial consultation.   Hemoglobin has ranged between 15.7 - 16.1 in the past 4 years.  She denied any cardiopulmonary disease.  She denied any tobacco use. She was felt to possibly have sleep apnea (recent increasing snoring).  Work-up revealed a hematocrit of 48.0, hemoglobin 16.3, MCV 89.4, platelets 280,000, WBC 5900 with an ANC of 2800.  Carbon monoxide level was 4.4% (0-3.6%).  Epo level was 13.5 (2.6-18.5).  JAK2 V617F, exon 12-15, CALR, and MPL were negative.  BCR-ABL was normal.  Ferritin was 48.  Urinalysis revealed no blood, moderate leukocytes, and few bacteria.  Peripheral smear revealed a mildly elevated hemoglobin with normal RBC morphology.  WBC and differential were normal.  Platelet count and morphology were normal.  Symptomatically, patient is doing well. She denies any acute changes from her visit on 01/17/2018. Patient denies bleeding; no hematochezia, melena, or gross hematuria. Patient denies that she has experienced any B symptoms. She denies any interval infections.   After further investigation in her home, she found that she in fact does not have a carbon monoxide detector in her home. Patient has ordered an in home detector. She denies any smoking, or exposure to second hand smoke.   Patient advises that she maintains an adequate appetite. She is eating well. Weight today is 222 lb 10.6 oz (101 kg), which compared to her last visit to the clinic, represents a 1 pound increase.     Patient denies pain in the clinic today.   Past Medical History:  Diagnosis Date  . Depression   . GERD (gastroesophageal reflux disease)   . Heartburn   . Hyperlipidemia   . Hypertension     Pt denies  . Wears contact lenses     Past Surgical History:  Procedure Laterality Date  . COLONOSCOPY WITH PROPOFOL N/A 10/02/2015   Procedure: COLONOSCOPY WITH PROPOFOL;  Surgeon: Lucilla Lame, MD;  Location: [facility];  Service: Endoscopy;  Laterality: N/A;  . KNEE SURGERY    . POLYPECTOMY  10/02/2015   Procedure: POLYPECTOMY;  Surgeon: Lucilla Lame, MD;  Location: [facility];  Service: Endoscopy;;  . TONSILLECTOMY  2006    Family History  Problem Relation Age of Onset  . Multiple sclerosis Mother   . Cancer Father 68       stomach cancer died age 44  . Breast cancer Maternal Grandmother 78  . Drug abuse Brother   . Kidney disease Neg Hx   . Bladder Cancer Neg Hx     Social History:  reports that she quit smoking about 33 years ago. Her smoking use included cigarettes. She has a 1.00 pack-year smoking history. She has never used smokeless tobacco. She reports current alcohol use of about 2.0 standard drinks of alcohol per week. She reports that she does not use drugs.  She is a social drinker (infrequent). 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 Patient denies known exposures to radiation on toxins. The patient is alone today.   Allergies:  Allergies  Allergen Reactions  . Sulfa Antibiotics Rash    Current Medications: Current Outpatient Medications  Medication Sig Dispense Refill  . aspirin EC 81 MG tablet Take 81 mg by  mouth daily.    Marland Kitchen buPROPion (WELLBUTRIN XL) 150 MG 24 hr tablet Take 1 tablet (150 mg total) by mouth every morning. 90 tablet 2  . Calcium Carbonate-Vit D-Min (CALCIUM 1200 PO) Take 1 tablet by mouth daily.      . Coenzyme Q10 (COQ-10 PO) Take 1 tablet by mouth daily.     . Cranberry 500 MG CAPS Take one by mouth daily    . cyanocobalamin 500 MCG tablet Take 500 mcg by mouth daily.    . hydrochlorothiazide (HYDRODIURIL) 25 MG tablet Take 1 tablet (25 mg total) by mouth daily. 90 tablet 0  . omeprazole (PRILOSEC) 20 MG capsule  Take 1 capsule (20 mg total) by mouth daily. 90 capsule 0  . simvastatin (ZOCOR) 10 MG tablet TAKE 1 TABLET BY MOUTH AT BEDTIME 90 tablet 3   No current facility-administered medications for this visit.     Review of Systems:  GENERAL:  Feels fine.  No problem.  No fevers, sweats or weight loss. PERFORMANCE STATUS (ECOG):  0 HEENT:  No visual changes, runny nose, sore throat, mouth sores or tenderness. Lungs: Shortness of breath with exertion (stairs).  No cough.  No hemoptysis.  Snores. Cardiac:  No chest pain, palpitations, orthopnea, or PND. GI:  No nausea, vomiting, diarrhea, constipation, melena or hematochezia. GU:  No urgency, frequency, dysuria, or hematuria. Musculoskeletal:  No back pain.  No joint pain.  No muscle tenderness. Extremities:  No pain or swelling. Skin:  No rashes or skin changes. Neuro:  No headache, numbness or weakness, balance or coordination issues. Endocrine:  No diabetes, thyroid issues, hot flashes or night sweats. Psych:  No mood changes, depression or anxiety. Pain:  No focal pain. Review of systems:  All other systems reviewed and found to be negative.   Physical Exam: Blood pressure 127/85, pulse 71, temperature 98.3 F (36.8 C), temperature source Oral, resp. rate 16, weight 222 lb 10.6 oz (101 kg), SpO2 100 %. GENERAL:  Well developed, well nourished, woman sitting comfortably in the exam room in no acute distress. MENTAL STATUS:  Alert and oriented to person, place and time. HEAD:  Shoulder length styled brown hair.  Normocephalic, atraumatic, face symmetric, no Cushingoid features. EYES:  Blue eyes.  No conjunctivitis or scleral icterus. NEUROLOGICAL: Unremarkable. PSYCH:  Appropriate.    No visits with results within 3 Day(s) from this visit.  Latest known visit with results is:  Appointment on 01/17/2018  Component Date Value Ref Range Status  . Color, Urine 01/17/2018 YELLOW  YELLOW Final  . APPearance 01/17/2018 HAZY* CLEAR Final  .  Specific Gravity, Urine 01/17/2018 1.020  1.005 - 1.030 Final  . pH 01/17/2018 7.0  5.0 - 8.0 Final  . Glucose, UA 01/17/2018 NEGATIVE  NEGATIVE mg/dL Final  . Hgb urine dipstick 01/17/2018 NEGATIVE  NEGATIVE Final  . Bilirubin Urine 01/17/2018 NEGATIVE  NEGATIVE Final  . Ketones, ur 01/17/2018 NEGATIVE  NEGATIVE mg/dL Final  . Protein, ur 01/17/2018 NEGATIVE  NEGATIVE mg/dL Final  . Nitrite 01/17/2018 NEGATIVE  NEGATIVE Final  . Leukocytes, UA 01/17/2018 MODERATE* NEGATIVE Final  . Squamous Epithelial / LPF 01/17/2018 0-5  0 - 5 Final  . WBC, UA 01/17/2018 11-20  0 - 5 WBC/hpf Final  . RBC / HPF 01/17/2018 0-5  0 - 5 RBC/hpf Final  . Bacteria, UA 01/17/2018 FEW* NONE SEEN Final   Performed at [facility], [address], [place], [place] [address]    Assessment:  Hannah Gessel  Ferguson is a 58 y.o. 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   Hemoglobin has ranged between 15.7 - 16.1 in the past 4 years.  She denies any cardiopulmonary disease.  She denies any tobacco use. She may have sleep apnea (recent increasing snoring).  Work-up on 01/17/2018 revealed a hematocrit of 48.0, hemoglobin 16.3, MCV 89.4, platelets 280,000, WBC 5900 with an ANC of 2800.  Carbon monoxide level was 4.4% (0-3.6%).  Normal labs included:  epo level (13.5), JAK2 V617F, exon 12-15, CALR, MPL, and BCR-ABL.  Ferritin was 48.  Urinalysis revealed no blood, moderate leukocytes, and few bacteria.  She has a history of B12 deficiency.  She has been on oral B12 for 3 years.  B12 was 289 on 08/15/2013,  > 1500 on 12/06/2016, and 1277 on 12/21/2017.  Symptomatically, she denies any complaint.  Exam is unremarkable.  Plan: 1.   Erythrocytosis  Review work-up.   Discuss negative studies.  No evidence of a myeloproliferative disorder. Discuss secondary etiologies Carbon monoxide level slightly elevated.  Discuss CO2 monitors. Possible sleep apnea. Follow-up with ENT (Dr Charolett Bumpers) re: PSG studies. 2.   B12 deficiency  B12 level  was 1277 on 12/21/2017.  B12 goal is 400.  Review cutting back on B12 with follow-up level to ensure level remains adequate. 3.   RTC in 1 year for MD assessment and labs (CBC with diff, carbon monoxide level).   Honor Loh, NP  01/31/2018, 9:04 AM   I saw and evaluated the patient, participating in the key portions of the service and reviewing pertinent diagnostic studies and records.  I reviewed the nurse practitioner's note and agree with the findings and the plan.  The assessment and plan were discussed with the patient.  A few questions were asked by the patient and answered.   Nolon Stalls, MD 01/31/2018,9:04 AM

## 2018-01-31 NOTE — Progress Notes (Signed)
Pt here for follow up. Denies any concerns at this time.  

## 2018-02-13 ENCOUNTER — Encounter: Payer: Self-pay | Admitting: Family Medicine

## 2018-02-13 ENCOUNTER — Encounter: Payer: Self-pay | Admitting: Hematology and Oncology

## 2018-02-14 MED ORDER — SIMVASTATIN 10 MG PO TABS: ORAL_TABLET | ORAL | 3 refills | Status: DC

## 2018-02-23 DIAGNOSIS — G4733 Obstructive sleep apnea (adult) (pediatric): Secondary | ICD-10-CM | POA: Diagnosis not present

## 2018-03-19 ENCOUNTER — Encounter: Payer: Self-pay | Admitting: Family Medicine

## 2018-03-19 ENCOUNTER — Other Ambulatory Visit: Payer: Self-pay | Admitting: Family Medicine

## 2018-03-19 DIAGNOSIS — D751 Secondary polycythemia: Secondary | ICD-10-CM

## 2018-03-19 DIAGNOSIS — R41840 Attention and concentration deficit: Secondary | ICD-10-CM

## 2018-04-17 ENCOUNTER — Other Ambulatory Visit: Payer: Self-pay

## 2018-04-17 DIAGNOSIS — K2100 Gastro-esophageal reflux disease with esophagitis, without bleeding: Secondary | ICD-10-CM

## 2018-04-17 DIAGNOSIS — I10 Essential (primary) hypertension: Secondary | ICD-10-CM

## 2018-04-17 MED ORDER — HYDROCHLOROTHIAZIDE 25 MG PO TABS: 25.0000 mg | ORAL_TABLET | Freq: Every day | ORAL | 0 refills | Status: DC

## 2018-04-17 MED ORDER — OMEPRAZOLE 20 MG PO CPDR: 20.0000 mg | DELAYED_RELEASE_CAPSULE | Freq: Every day | ORAL | 0 refills | Status: DC

## 2018-04-17 NOTE — Progress Notes (Signed)
Pt needs to have OV before further refills are given. QT #30 was refilled.

## 2018-04-25 ENCOUNTER — Encounter: Payer: Self-pay | Admitting: Family Medicine

## 2018-04-26 ENCOUNTER — Other Ambulatory Visit: Payer: Self-pay

## 2018-04-26 DIAGNOSIS — I10 Essential (primary) hypertension: Secondary | ICD-10-CM

## 2018-04-26 DIAGNOSIS — K2100 Gastro-esophageal reflux disease with esophagitis, without bleeding: Secondary | ICD-10-CM

## 2018-04-26 MED ORDER — OMEPRAZOLE 20 MG PO CPDR: 20.0000 mg | DELAYED_RELEASE_CAPSULE | Freq: Every day | ORAL | 2 refills | Status: DC

## 2018-04-26 MED ORDER — SIMVASTATIN 10 MG PO TABS: ORAL_TABLET | ORAL | 2 refills | Status: DC

## 2018-04-26 MED ORDER — BUPROPION HCL ER (XL) 150 MG PO TB24: 150.0000 mg | ORAL_TABLET | Freq: Every morning | ORAL | 2 refills | Status: DC

## 2018-04-26 MED ORDER — HYDROCHLOROTHIAZIDE 25 MG PO TABS: 25.0000 mg | ORAL_TABLET | Freq: Every day | ORAL | 2 refills | Status: DC

## 2018-05-17 ENCOUNTER — Other Ambulatory Visit: Payer: Self-pay | Admitting: Family Medicine

## 2018-05-17 DIAGNOSIS — I10 Essential (primary) hypertension: Secondary | ICD-10-CM

## 2018-05-19 IMAGING — US US BREAST*R* LIMITED INC AXILLA
1 series · 11 of 11 positions shown · non-contrast
Comparison: Previous exam(s).

CLINICAL DATA: Screening recall for a possible right breast mass.

EXAM:
2D DIGITAL DIAGNOSTIC UNILATERAL RIGHT MAMMOGRAM WITH CAD AND
ADJUNCT TOMO
RIGHT BREAST ULTRASOUND

[Series 1: us breast*right* limited inc axilla · 0.07mm/px · 11 of 11 slices shown]
[im 1/11]
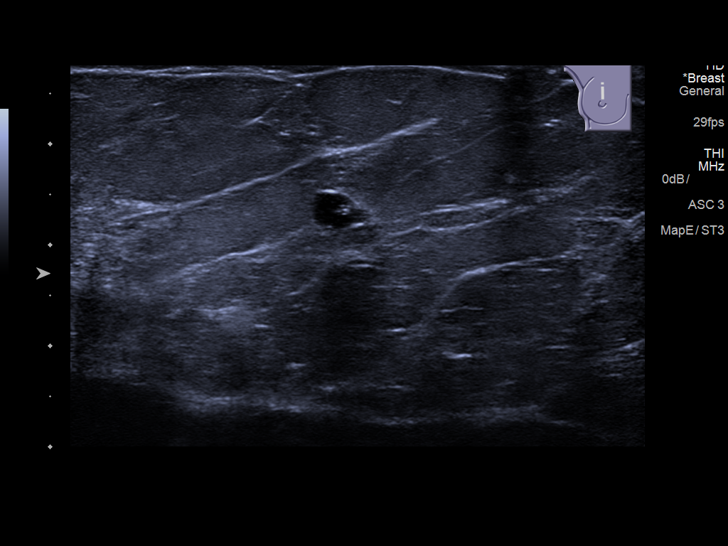
[im 2/11]
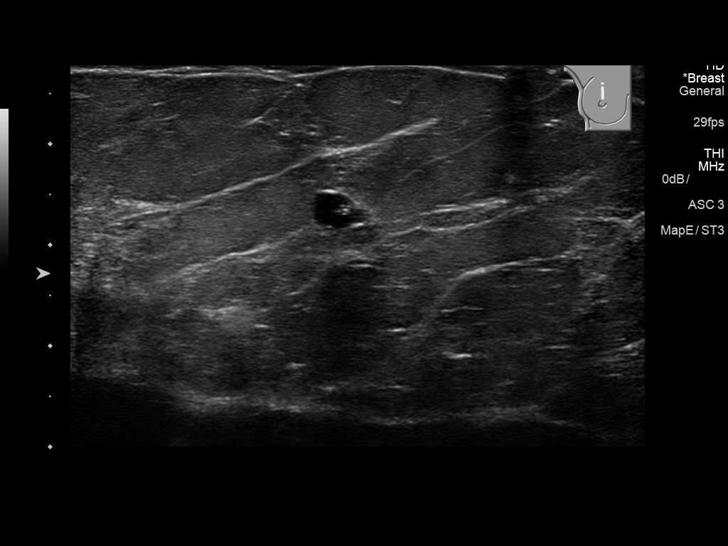
[im 3/11]
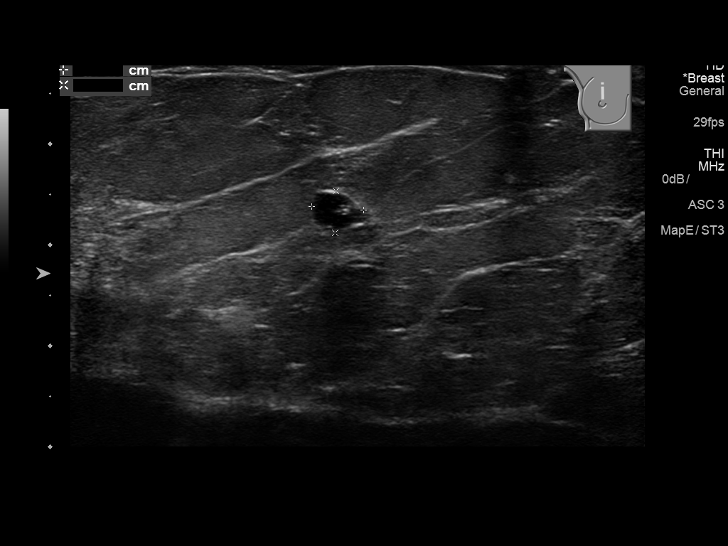
[im 4/11]
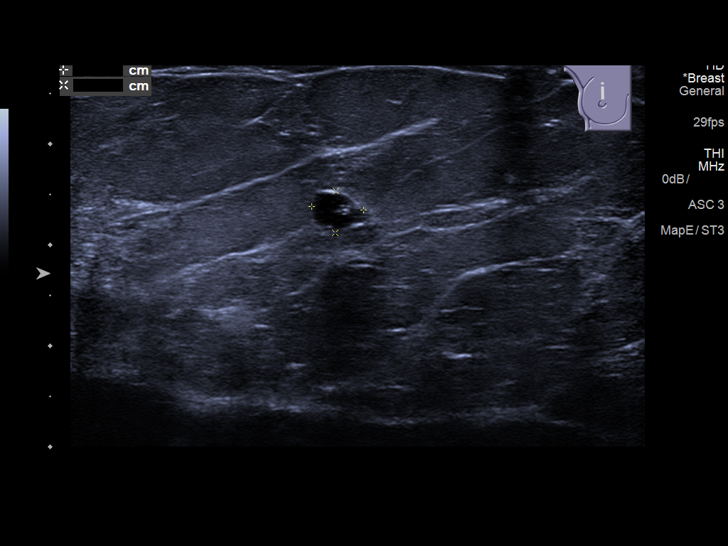
[im 5/11]
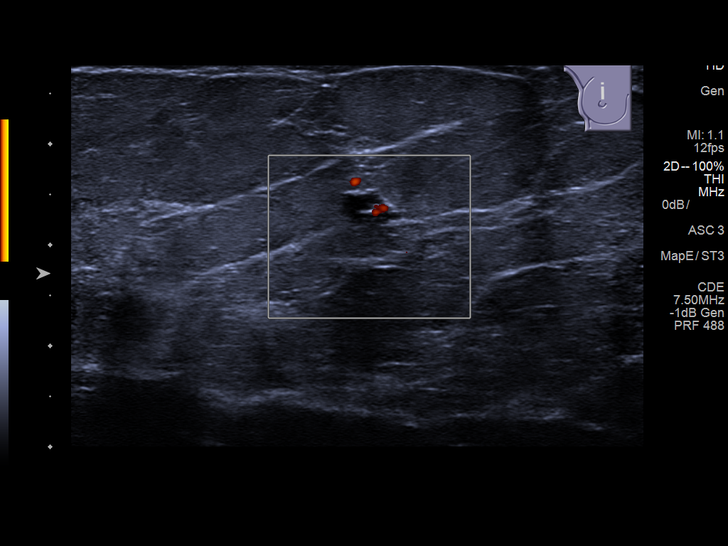
[im 6/11]
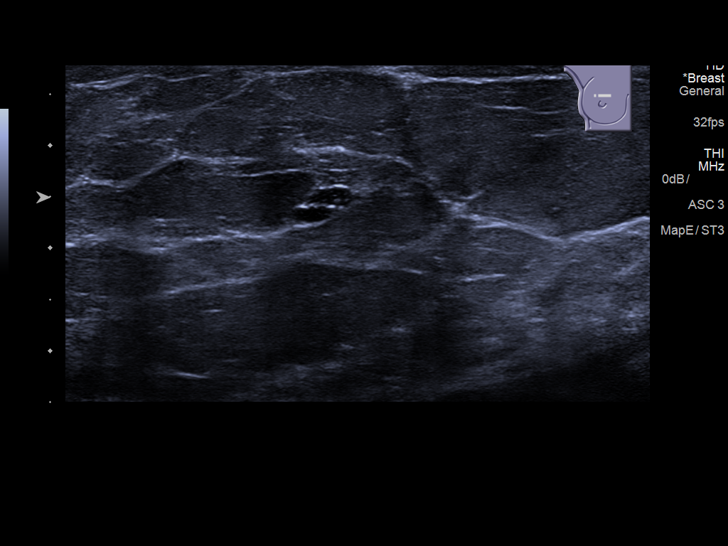
[im 7/11]
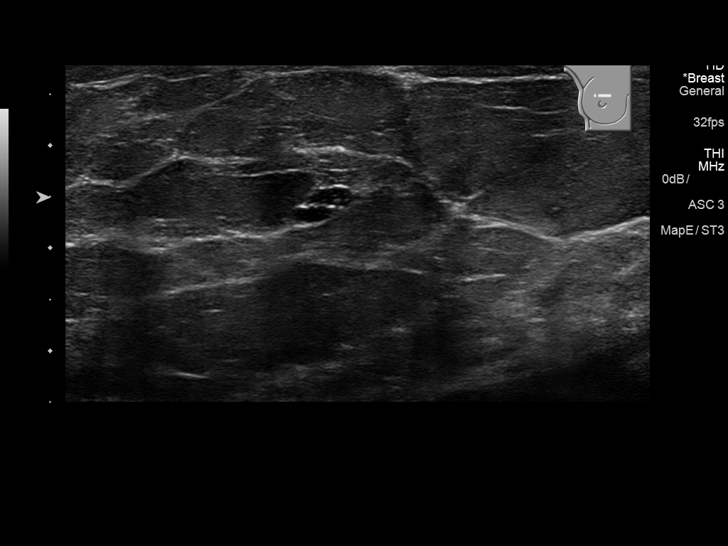
[im 8/11]
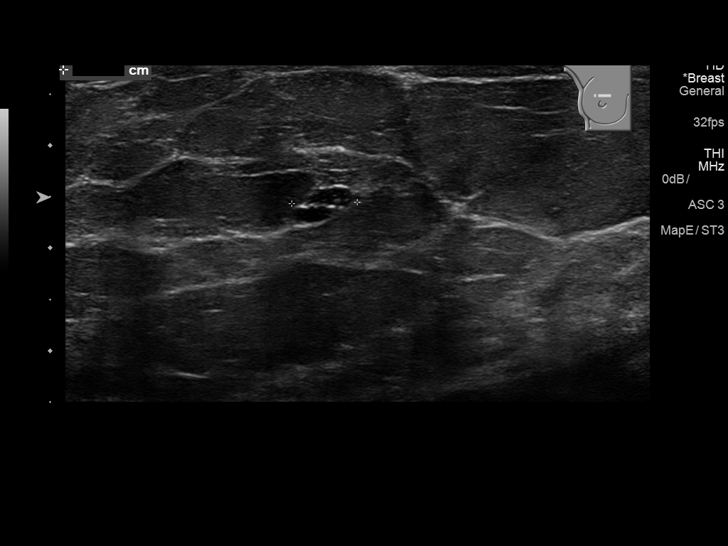
[im 9/11]
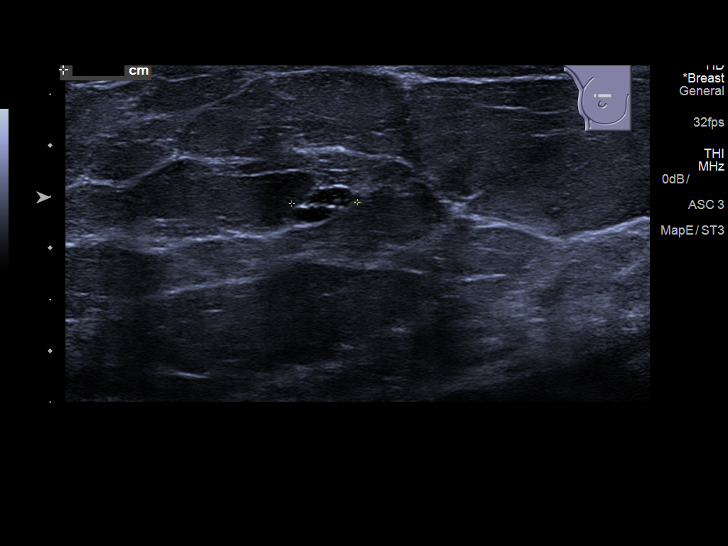
[im 10/11]
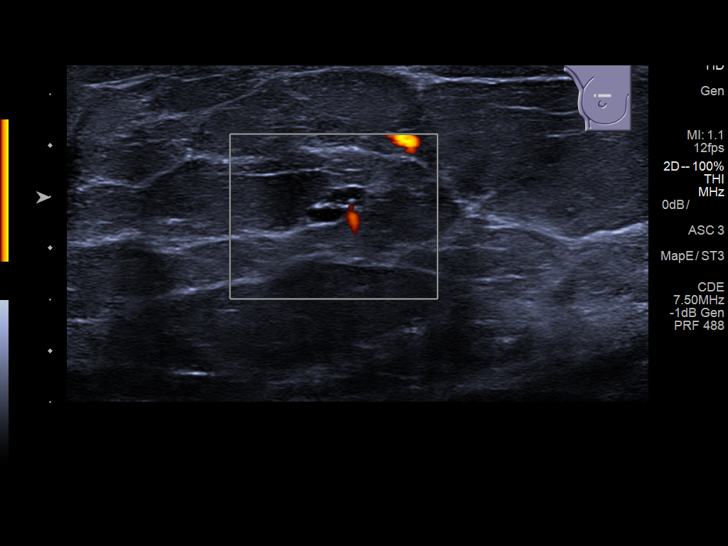
[im 11/11]
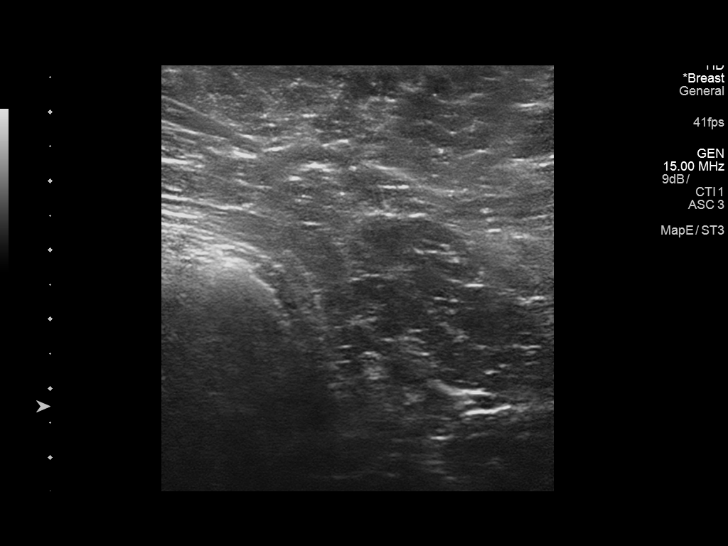

[11 of 11 positions shown; findings below may reference images not displayed]

ACR Breast Density Category b: There are scattered areas of
fibroglandular density.
FINDINGS: In the upper-inner quadrant of the right breast, there is a
circumscribed oval mass which is either a bilobed or represents 2
adjacent masses which together measure approximately 7 mm.

Mammographic images were processed with CAD.

Ultrasound targeted to the right breast at 12 o'clock, 3 cm from the
nipple demonstrates a hypoechoic masses in the internal septation
and circumscribed margins measuring 6 x 4 x 5 mm favored to
represent a mildly complicated. No suspicious masses or areas of
shadowing are identified.
IMPRESSION: 1. The 6 mm mass in the right breast at 12 o'clock is probably
benign, favored to represent a complicated cyst.

RECOMMENDATION:
Six-month follow-up right breast mammogram and ultrasound is
recommended.

I have discussed the findings and recommendations with the patient.
Results were also provided in writing at the conclusion of the
visit. If applicable, a reminder letter will be sent to the patient
regarding the next appointment.

BI-RADS CATEGORY  3: Probably benign.

## 2018-06-20 DIAGNOSIS — G4730 Sleep apnea, unspecified: Secondary | ICD-10-CM | POA: Diagnosis not present

## 2018-07-05 DIAGNOSIS — R0683 Snoring: Secondary | ICD-10-CM | POA: Diagnosis not present

## 2018-07-05 DIAGNOSIS — R0902 Hypoxemia: Secondary | ICD-10-CM | POA: Diagnosis not present

## 2018-07-12 ENCOUNTER — Encounter: Payer: Self-pay | Admitting: Family Medicine

## 2018-07-12 ENCOUNTER — Telehealth: Payer: Self-pay | Admitting: Family Medicine

## 2018-07-12 DIAGNOSIS — G4734 Idiopathic sleep related nonobstructive alveolar hypoventilation: Secondary | ICD-10-CM

## 2018-07-12 DIAGNOSIS — D751 Secondary polycythemia: Secondary | ICD-10-CM

## 2018-07-12 NOTE — Addendum Note (Signed)
Addended by: Lucille Passy on: 07/12/2018 01:52 PM   Modules accepted: Orders

## 2018-07-12 NOTE — Telephone Encounter (Signed)
Dr. Margaretha Sheffield MD (ENT) called regarding mutal patient.  He is request to speak with PCP for information.  He left his cellphone  Cell :[phone]

## 2018-07-12 NOTE — Telephone Encounter (Signed)
Spoke with Dr. Kathyrn Sheriff.  Sleep study showed she does not have sleep apnea but she does desat into the low 80s sometimes for over an hour at a time.  He recommend O2 per [facility] and an overnight oximetry study.  Will refer to pulmonary urgently to see if we can get her started on these interventions.

## 2018-07-12 NOTE — Telephone Encounter (Signed)
Left VM for Dr. Kathyrn Sheriff to call me back on my cell phone.

## 2018-07-16 NOTE — Addendum Note (Signed)
Addended by: Lucille Passy on: 07/16/2018 12:58 PM   Modules accepted: Orders

## 2018-08-07 ENCOUNTER — Encounter: Payer: Self-pay | Admitting: Pulmonary Disease

## 2018-08-07 ENCOUNTER — Other Ambulatory Visit: Payer: Self-pay

## 2018-08-07 ENCOUNTER — Ambulatory Visit: Payer: BC Managed Care – PPO | Admitting: Pulmonary Disease

## 2018-08-07 VITALS — BP 130/76 | HR 78 | Temp 98.6°F | Ht 62.0 in | Wt 217.0 lb

## 2018-08-07 DIAGNOSIS — R0902 Hypoxemia: Secondary | ICD-10-CM | POA: Diagnosis not present

## 2018-08-07 DIAGNOSIS — I10 Essential (primary) hypertension: Secondary | ICD-10-CM

## 2018-08-07 DIAGNOSIS — G4734 Idiopathic sleep related nonobstructive alveolar hypoventilation: Secondary | ICD-10-CM | POA: Diagnosis not present

## 2018-08-07 NOTE — Progress Notes (Signed)
Subjective:    Patient ID: Hannah Ferguson, female    DOB: 1961/01/04, 58 y.o.   MRN: [id number]  History of high blood counts  Evaluated recently by hematology All other evaluations have been negative Recent sleep study was negative for significant sleep apnea but did reveal low oxygen levels Has no daytime symptoms suggesting significant sleep disordered breathing  Was born premature Had recurrent bronchitis, respiratory infections growing up Was never diagnosed with asthma or any significant lung disease  Some exposure to secondhand smoke in the past  No occupational predisposition at present Never smoker  No shortness of breath with normal activity     Review of Systems  Constitutional: Negative for fever and unexpected weight change.  HENT: Negative for congestion, dental problem, ear pain, nosebleeds, postnasal drip, rhinorrhea, sinus pressure, sneezing, sore throat and trouble swallowing.   Eyes: Negative for redness and itching.  Respiratory: Negative for cough, chest tightness, shortness of breath and wheezing.   Cardiovascular: Positive for leg swelling. Negative for palpitations.  Gastrointestinal: Negative for nausea and vomiting.  Genitourinary: Negative for dysuria.  Musculoskeletal: Negative for joint swelling.  Skin: Negative for rash.  Allergic/Immunologic: Negative.  Negative for environmental allergies, food allergies and immunocompromised state.  Neurological: Negative for headaches.  Hematological: Does not bruise/bleed easily.  Psychiatric/Behavioral: Negative for dysphoric mood. The patient is not nervous/anxious.    Past Medical History:  Diagnosis Date  . Depression   . GERD (gastroesophageal reflux disease)   . Heartburn   . Hyperlipidemia   . Hypertension    Pt denies  . Wears contact lenses    Family History  Problem Relation Age of Onset  . Multiple sclerosis Mother   . Cancer Father 52       stomach cancer died age 54  . Breast  cancer Maternal Grandmother 39  . Drug abuse Brother   . Kidney disease Neg Hx   . Bladder Cancer Neg Hx    Never smoker No occupational predisposition    Objective:   Physical Exam Vitals signs reviewed.  Constitutional:      Appearance: Normal appearance.  HENT:     Head: Normocephalic and atraumatic.  Neck:     Musculoskeletal: Normal range of motion and neck supple. No neck rigidity or muscular tenderness.  Cardiovascular:     Rate and Rhythm: Normal rate and regular rhythm.  Pulmonary:     Effort: Pulmonary effort is normal. No respiratory distress.     Breath sounds: Normal breath sounds. No stridor. No wheezing or rhonchi.  Abdominal:     General: There is no distension.     Palpations: There is no mass.     Tenderness: There is no abdominal tenderness.  Musculoskeletal: Normal range of motion.        General: No swelling or tenderness.  Skin:    General: Skin is warm and dry.     Coloration: Skin is not jaundiced or pale.  Neurological:     General: No focal deficit present.     Mental Status: She is alert.     Cranial Nerves: No cranial nerve deficit.  Psychiatric:        Mood and Affect: Mood normal.        Behavior: Behavior normal.   Actual sleep study not available for review but only notable for desaturations according to patient    Assessment & Plan:  .  Nocturnal desaturations  .  Polycythemia  .  History of prematurity and  multiple lung infections in her youth/never smoker/never diagnosed with any significant chronic lung disease  Plan .  Obtain echocardiogram to rule out pulmonary hypertension .  Obtain a nocturnal oximetry to assess degree of desaturations .  Obtain a pulmonary function study to assess baseline lung function  .  Weight loss efforts .  We will see her back in the office in 2 months

## 2018-08-07 NOTE — Patient Instructions (Addendum)
Nocturnal desaturations-low oxygen at night  We will obtain a nocturnal oximetry Obtain a breathing study Echocardiogram-assess for pressures in the blood vessels in the lungs  Depending on what the oximetry shows-we will place her on oxygen at night  We will see you back in about 6 to 8 weeks Call with significant concerns

## 2018-08-09 ENCOUNTER — Ambulatory Visit: Payer: BLUE CROSS/BLUE SHIELD | Admitting: Psychology

## 2018-08-10 ENCOUNTER — Ambulatory Visit: Payer: Self-pay | Admitting: Psychology

## 2018-08-13 ENCOUNTER — Encounter: Payer: Self-pay | Admitting: Pulmonary Disease

## 2018-08-13 DIAGNOSIS — J449 Chronic obstructive pulmonary disease, unspecified: Secondary | ICD-10-CM | POA: Diagnosis not present

## 2018-08-13 DIAGNOSIS — R0902 Hypoxemia: Secondary | ICD-10-CM | POA: Diagnosis not present

## 2018-08-20 ENCOUNTER — Telehealth: Payer: Self-pay | Admitting: Pulmonary Disease

## 2018-08-20 NOTE — Telephone Encounter (Signed)
Call patient  Overnight oximetry reviewed  No significant oxygen desaturations noted on oximetry  No oxygen supplementation is needed  Follow-up as scheduled

## 2018-08-20 NOTE — Telephone Encounter (Signed)
Pt aware ONO-normal No need for 02 at this time. Nothing further needed.

## 2018-08-21 ENCOUNTER — Other Ambulatory Visit: Payer: Self-pay | Admitting: Pulmonary Disease

## 2018-08-21 DIAGNOSIS — I272 Pulmonary hypertension, unspecified: Secondary | ICD-10-CM

## 2018-08-23 ENCOUNTER — Ambulatory Visit: Payer: BLUE CROSS/BLUE SHIELD | Admitting: Psychology

## 2018-08-24 ENCOUNTER — Ambulatory Visit: Payer: Self-pay | Admitting: Psychology

## 2018-08-29 ENCOUNTER — Ambulatory Visit (INDEPENDENT_AMBULATORY_CARE_PROVIDER_SITE_OTHER): Payer: BC Managed Care – PPO

## 2018-08-29 ENCOUNTER — Other Ambulatory Visit: Payer: Self-pay

## 2018-08-29 DIAGNOSIS — I272 Pulmonary hypertension, unspecified: Secondary | ICD-10-CM | POA: Diagnosis not present

## 2018-09-14 ENCOUNTER — Telehealth: Payer: Self-pay | Admitting: General Surgery

## 2018-09-14 NOTE — Telephone Encounter (Signed)
Message routed to [place]:  This patient was seen by Dr. Ander Slade 08/07/18 and PFT was ordered with a follow up in 6 - 8 weeks. Is she on your list of patient's that need to be scheduled?  She responded back electronically after she was a test for COVID was required. She agreed to the test.

## 2018-09-20 NOTE — Telephone Encounter (Signed)
Scheduled pft on 10/11/2018-pr

## 2018-09-26 ENCOUNTER — Ambulatory Visit (INDEPENDENT_AMBULATORY_CARE_PROVIDER_SITE_OTHER): Payer: BC Managed Care – PPO

## 2018-09-26 DIAGNOSIS — Z23 Encounter for immunization: Secondary | ICD-10-CM | POA: Diagnosis not present

## 2018-09-26 NOTE — Progress Notes (Signed)
Pt came in for her flu vaccine. Vaccine given in the left deltoid. Pt tolerated injection well. No signs/symptoms of a reaction. VIS given to pt.

## 2018-10-02 ENCOUNTER — Telehealth: Payer: Self-pay | Admitting: Pulmonary Disease

## 2018-10-02 NOTE — Telephone Encounter (Signed)
Attempted to call patient, no answer, left message to call back.  

## 2018-10-02 NOTE — Telephone Encounter (Signed)
Call returned to patient, she reports she was returning a phone call to schedule her covid text prior to her PFT's. I made her aware I would get this message to our coordinator. Voiced understanding. She inquired as to whether she could have it done at any site. I made her aware it was best to ustilize [place] so we can be sure the results are sent directly to [place] unless there were extenuating circumstances. Voiced understanding.   Hannah Ferguson patient returned call for covid scheduling. PFT 10/01. Thanks.

## 2018-10-03 ENCOUNTER — Other Ambulatory Visit: Payer: Self-pay | Admitting: Pulmonary Disease

## 2018-10-03 NOTE — Telephone Encounter (Signed)
Spoke with patient, scheduled pre-procedure covid testing. Nothing further needed at this time.

## 2018-10-08 ENCOUNTER — Other Ambulatory Visit (HOSPITAL_COMMUNITY)
Admission: RE | Admit: 2018-10-08 | Discharge: 2018-10-08 | Disposition: A | Discharge status: Home / Self Care | Payer: BC Managed Care – PPO | Source: Ambulatory Visit | Attending: Pulmonary Disease | Admitting: Pulmonary Disease

## 2018-10-08 DIAGNOSIS — Z01812 Encounter for preprocedural laboratory examination: Secondary | ICD-10-CM | POA: Insufficient documentation

## 2018-10-08 DIAGNOSIS — Z20828 Contact with and (suspected) exposure to other viral communicable diseases: Secondary | ICD-10-CM | POA: Diagnosis not present

## 2018-10-09 LAB — NOVEL CORONAVIRUS, NAA (HOSP ORDER, SEND-OUT TO REF LAB; TAT 18-24 HRS): SARS-CoV-2, NAA: NOT DETECTED

## 2018-10-11 ENCOUNTER — Ambulatory Visit: Payer: BC Managed Care – PPO | Admitting: Pulmonary Disease

## 2018-10-11 ENCOUNTER — Ambulatory Visit (INDEPENDENT_AMBULATORY_CARE_PROVIDER_SITE_OTHER): Payer: BC Managed Care – PPO | Admitting: Pulmonary Disease

## 2018-10-11 ENCOUNTER — Other Ambulatory Visit: Payer: Self-pay

## 2018-10-11 ENCOUNTER — Encounter: Payer: Self-pay | Admitting: Pulmonary Disease

## 2018-10-11 VITALS — BP 132/74 | HR 75 | Temp 97.7°F | Ht 63.0 in | Wt 218.0 lb

## 2018-10-11 DIAGNOSIS — F320 Major depressive disorder, single episode, mild: Secondary | ICD-10-CM

## 2018-10-11 DIAGNOSIS — D751 Secondary polycythemia: Secondary | ICD-10-CM | POA: Diagnosis not present

## 2018-10-11 DIAGNOSIS — R0902 Hypoxemia: Secondary | ICD-10-CM | POA: Diagnosis not present

## 2018-10-11 DIAGNOSIS — I10 Essential (primary) hypertension: Secondary | ICD-10-CM

## 2018-10-11 LAB — PULMONARY FUNCTION TEST
DL/VA % pred: 121 %
DL/VA: 5.18 ml/min/mmHg/L
DLCO unc % pred: 112 %
DLCO unc: 22.12 ml/min/mmHg
FEF 25-75 Post: 2.07 L/sec
FEF 25-75 Pre: 1.82 L/sec
FEF2575-%Change-Post: 13 %
FEF2575-%Pred-Post: 88 %
FEF2575-%Pred-Pre: 78 %
FEV1-%Change-Post: 4 %
FEV1-%Pred-Post: 86 %
FEV1-%Pred-Pre: 83 %
FEV1-Post: 2.16 L
FEV1-Pre: 2.07 L
FEV1FVC-%Change-Post: 0 %
FEV1FVC-%Pred-Pre: 98 %
FEV6-%Change-Post: 4 %
FEV6-%Pred-Post: 90 %
FEV6-%Pred-Pre: 86 %
FEV6-Post: 2.8 L
FEV6-Pre: 2.67 L
FEV6FVC-%Pred-Post: 103 %
FEV6FVC-%Pred-Pre: 103 %
FVC-%Change-Post: 4 %
FVC-%Pred-Post: 87 %
FVC-%Pred-Pre: 83 %
FVC-Post: 2.8 L
FVC-Pre: 2.68 L
Post FEV1/FVC ratio: 77 %
Post FEV6/FVC ratio: 100 %
Pre FEV1/FVC ratio: 77 %
Pre FEV6/FVC Ratio: 100 %
RV % pred: 89 %
RV: 1.72 L
TLC % pred: 90 %
TLC: 4.42 L

## 2018-10-11 NOTE — Progress Notes (Signed)
Subjective:    Patient ID: Hannah Ferguson, female    DOB: [birth year], 58 y.o.   MRN: [id number]  History of polycythemia  Evaluated recently by hematology All other evaluations have been negative Recent sleep study was negative for significant sleep apnea but did reveal low oxygen levels Has no daytime symptoms suggesting significant sleep disordered breathing Occasional snoring usually when she is very tired  Was born premature Had recurrent bronchitis, respiratory infections growing up Was never diagnosed with asthma or any significant lung disease  Some exposure to secondhand smoke in the past  No occupational predisposition at present  Never smoker  No shortness of breath with normal activity Has been feeling relatively well with no significant symptoms  Does not exercise regularly     Review of Systems  Constitutional: Negative for fever and unexpected weight change.  HENT: Negative for congestion, dental problem, ear pain, nosebleeds, postnasal drip, rhinorrhea, sinus pressure, sneezing, sore throat and trouble swallowing.   Eyes: Negative for redness and itching.  Respiratory: Negative for cough, chest tightness, shortness of breath and wheezing.   Cardiovascular: Negative for palpitations and leg swelling.  Gastrointestinal: Negative for nausea and vomiting.  Genitourinary: Negative for dysuria.  Musculoskeletal: Negative for joint swelling.  Skin: Negative for rash.  Allergic/Immunologic: Negative.  Negative for environmental allergies, food allergies and immunocompromised state.  Neurological: Negative for headaches.  Hematological: Does not bruise/bleed easily.  Psychiatric/Behavioral: Negative for dysphoric mood. The patient is not nervous/anxious.   All other systems reviewed and are negative.  Past Medical History:  Diagnosis Date  . Depression   . GERD (gastroesophageal reflux disease)   . Heartburn   . Hyperlipidemia   . Hypertension    Pt denies   . Wears contact lenses    Family History  Problem Relation Age of Onset  . Multiple sclerosis Mother   . Cancer Father 53       stomach cancer died age 63  . Breast cancer Maternal Grandmother 46  . Drug abuse Brother   . Kidney disease Neg Hx   . Bladder Cancer Neg Hx    Never smoker No occupational predisposition    Objective:   Physical Exam Vitals signs reviewed.  Constitutional:      Appearance: Normal appearance.  HENT:     Head: Normocephalic and atraumatic.     Mouth/Throat:     Mouth: Mucous membranes are moist.  Eyes:     General:        Right eye: No discharge.        Left eye: No discharge.     Pupils: Pupils are equal, round, and reactive to light.  Neck:     Musculoskeletal: Normal range of motion and neck supple. No neck rigidity or muscular tenderness.  Cardiovascular:     Rate and Rhythm: Normal rate and regular rhythm.  Pulmonary:     Effort: Pulmonary effort is normal. No respiratory distress.     Breath sounds: Normal breath sounds. No stridor. No wheezing or rhonchi.  Abdominal:     General: There is no distension.     Palpations: There is no mass.     Tenderness: There is no abdominal tenderness.  Neurological:     Mental Status: She is alert.   Actual sleep study not available for review but only notable for desaturations according to patient  Recent overnight oximetry did not reveal significant desaturation  Recent echocardiogram-discussed with patient-no pulmonary hypertension  Pulmonary function study performed today  showed no obstruction, no restriction, normal diffusing capacity    Assessment & Plan:  .  Polycythemia -Work-up has been negative -Pulmonary work-up negative -Has no significant symptoms  .  History of prematurity and multiple lung infections in her youth/never smoker/never diagnosed with any significant chronic lung disease  .  No history of obstructive sleep apnea .  Repeat study for nocturnal desaturations was  negative   Plan .  Encouraged to continue weight loss efforts .  Regular exercises  .  We will see as needed .  Call with concerns

## 2018-10-11 NOTE — Progress Notes (Signed)
Full PFT performed today. °

## 2018-10-11 NOTE — Patient Instructions (Signed)
All your testing came back normal  Breathing study was within normal limits Echocardiogram was within normal limits Overnight oxygen saturation testing was within normal limits  You do not have significant symptoms to be concerned for sleep apnea  No other testing needs done at present  We will see you as needed

## 2018-10-26 DIAGNOSIS — D2261 Melanocytic nevi of right upper limb, including shoulder: Secondary | ICD-10-CM | POA: Diagnosis not present

## 2018-10-26 DIAGNOSIS — L814 Other melanin hyperpigmentation: Secondary | ICD-10-CM | POA: Diagnosis not present

## 2018-10-26 DIAGNOSIS — D2262 Melanocytic nevi of left upper limb, including shoulder: Secondary | ICD-10-CM | POA: Diagnosis not present

## 2018-10-26 DIAGNOSIS — L820 Inflamed seborrheic keratosis: Secondary | ICD-10-CM | POA: Diagnosis not present

## 2018-10-26 DIAGNOSIS — D1801 Hemangioma of skin and subcutaneous tissue: Secondary | ICD-10-CM | POA: Diagnosis not present

## 2019-01-30 ENCOUNTER — Ambulatory Visit: Payer: BLUE CROSS/BLUE SHIELD | Admitting: Hematology and Oncology

## 2019-01-30 ENCOUNTER — Other Ambulatory Visit: Payer: BLUE CROSS/BLUE SHIELD

## 2019-02-01 NOTE — Progress Notes (Unsigned)
Subjective:    Patient ID: Hannah Ferguson, female    DOB: 12-Feb-1960, 59 y.o.   MRN: [id number]  Chief Complaint  Patient presents with  . Annual Exam  . Depression  . Hyperlipidemia  . Hypertension    HPI Patient is in today for CPE with PAP. Last PAP completed 10.26.2017. She is also due for a Mammogram and will call [facility] to schedule if order is placed.  She is also needing her first BMD Scan there as well.  Immunizations are UTD.  Health Maintenance  Topic Date Due  . PAP SMEAR-Modifier  11/05/2018  . MAMMOGRAM  01/19/2019  . COLONOSCOPY  10/01/2020  . TETANUS/TDAP  07/27/2022  . INFLUENZA VACCINE  Completed  . Hepatitis C Screening  Completed  . HIV Screening  Completed    Past Medical History:  Diagnosis Date  . Depression   . GERD (gastroesophageal reflux disease)   . Heartburn   . Hyperlipidemia   . Hypertension    Pt denies  . Wears contact lenses     Past Surgical History:  Procedure Laterality Date  . COLONOSCOPY WITH PROPOFOL N/A 10/02/2015   Procedure: COLONOSCOPY WITH PROPOFOL;  Surgeon: Lucilla Lame, MD;  Location: [facility];  Service: Endoscopy;  Laterality: N/A;  . KNEE SURGERY    . POLYPECTOMY  10/02/2015   Procedure: POLYPECTOMY;  Surgeon: Lucilla Lame, MD;  Location: [facility];  Service: Endoscopy;;  . TONSILLECTOMY  2006    Family History  Problem Relation Age of Onset  . Multiple sclerosis Mother   . Cancer Father 36       stomach cancer died age 47  . Breast cancer Maternal Grandmother 87  . Drug abuse Brother   . Kidney disease Neg Hx   . Bladder Cancer Neg Hx     Social History   Socioeconomic History  . Marital status: Married    Spouse name: Not on file  . Number of children: Not on file  . Years of education: Not on file  . Highest education level: Not on file  Occupational History  . Not on file  Tobacco Use  . Smoking status: Former Smoker    Packs/day: 0.25    Years: 4.00    Pack years:  1.00    Types: Cigarettes    Quit date: 01/17/1985    Years since quitting: 34.0  . Smokeless tobacco: Never Used  Substance and Sexual Activity  . Alcohol use: Yes    Alcohol/week: 2.0 standard drinks    Types: 2 Glasses of wine per week    Comment:    . Drug use: No  . Sexual activity: Not Currently  Other Topics Concern  . Not on file  Social History Narrative  . Not on file   Social Determinants of Health   Financial Resource Strain:   . Difficulty of Paying Living Expenses: Not on file  Food Insecurity:   . Worried About Charity fundraiser in the Last Year: Not on file  . Ran Out of Food in the Last Year: Not on file  Transportation Needs:   . Lack of Transportation (Medical): Not on file  . Lack of Transportation (Non-Medical): Not on file  Physical Activity:   . Days of Exercise per Week: Not on file  . Minutes of Exercise per Session: Not on file  Stress:   . Feeling of Stress : Not on file  Social Connections:   . Frequency of  Communication with Friends and Family: Not on file  . Frequency of Social Gatherings with Friends and Family: Not on file  . Attends Religious Services: Not on file  . Active Member of Clubs or Organizations: Not on file  . Attends Archivist Meetings: Not on file  . Marital Status: Not on file  Intimate Partner Violence:   . Fear of Current or Ex-Partner: Not on file  . Emotionally Abused: Not on file  . Physically Abused: Not on file  . Sexually Abused: Not on file    Outpatient Medications Prior to Visit  Medication Sig Dispense Refill  . aspirin EC 81 MG tablet Take 81 mg by mouth daily.    Marland Kitchen buPROPion (WELLBUTRIN XL) 150 MG 24 hr tablet Take 1 tablet (150 mg total) by mouth every morning. 90 tablet 2  . Calcium Carbonate-Vit D-Min (CALCIUM 1200 PO) Take 1 tablet by mouth daily.      . Coenzyme Q10 (COQ-10 PO) Take 1 tablet by mouth daily.     . Cranberry 500 MG CAPS Take one by mouth daily    . cyanocobalamin 500 MCG  tablet Take 500 mcg by mouth daily.    . hydrochlorothiazide (HYDRODIURIL) 25 MG tablet TAKE 1 TABLET BY MOUTH DAILY 30 tablet 0  . omeprazole (PRILOSEC) 20 MG capsule Take 1 capsule (20 mg total) by mouth daily. 90 capsule 2  . simvastatin (ZOCOR) 10 MG tablet TAKE 1 TABLET BY MOUTH AT BEDTIME 90 tablet 2   No facility-administered medications prior to visit.    Allergies  Allergen Reactions  . Sulfa Antibiotics Rash    Review of Systems  Constitutional: Negative for fever and malaise/fatigue.  HENT: Negative for congestion and hearing loss.   Eyes: Negative for blurred vision, discharge and redness.  Respiratory: Negative for cough and shortness of breath.   Cardiovascular: Negative for chest pain, palpitations and leg swelling.  Gastrointestinal: Negative for abdominal pain and heartburn.  Genitourinary: Negative for dysuria.  Musculoskeletal: Negative for falls.  Skin: Negative for rash.  Neurological: Negative for loss of consciousness and headaches.  Endo/Heme/Allergies: Does not bruise/bleed easily.  Psychiatric/Behavioral: Negative for depression.  All other systems reviewed and are negative.      Objective:    Physical Exam Vitals and nursing note reviewed. Exam conducted with a chaperone present.  Constitutional:      Appearance: Normal appearance. She is not ill-appearing.  HENT:     Head: Normocephalic and atraumatic.     Right Ear: External ear normal.     Left Ear: External ear normal.     Nose: Nose normal.  Eyes:     General:        Right eye: No discharge.        Left eye: No discharge.  Cardiovascular:     Rate and Rhythm: Normal rate and regular rhythm.     Heart sounds: Normal heart sounds.  Pulmonary:     Effort: Pulmonary effort is normal.     Breath sounds: No wheezing.  Abdominal:     Palpations: Abdomen is soft. There is no mass.     Tenderness: There is no abdominal tenderness. There is no guarding.  Genitourinary:    General: Normal  vulva.     Vagina: Normal.     Cervix: Normal.     Uterus: Normal.      Adnexa: Right adnexa normal and left adnexa normal.     Rectum: Normal.  Musculoskeletal:  General: Normal range of motion.     Right lower leg: No edema.     Left lower leg: No edema.  Skin:    General: Skin is dry.  Neurological:     Mental Status: She is alert and oriented to person, place, and time.     Deep Tendon Reflexes: Reflexes normal.  Psychiatric:        Mood and Affect: Mood normal.        Behavior: Behavior normal.     There were no vitals taken for this visit. Wt Readings from Last 3 Encounters:  10/11/18 218 lb (98.9 kg)  08/07/18 217 lb (98.4 kg)  01/31/18 222 lb 10.6 oz (101 kg)     Lab Results  Component Value Date   WBC 5.9 01/17/2018   HGB 16.3 (H) 01/17/2018   HCT 48.0 (H) 01/17/2018   PLT 280 01/17/2018   GLUCOSE 102 (H) 12/21/2017   CHOL 208 (H) 12/21/2017   TRIG 173.0 (H) 12/21/2017   HDL 73.00 12/21/2017   LDLDIRECT 152.8 07/26/2012   LDLCALC 100 (H) 12/21/2017   ALT 25 12/21/2017   AST 18 12/21/2017   NA 139 12/21/2017   K 3.8 12/21/2017   CL 101 12/21/2017   CREATININE 0.94 12/21/2017   BUN 13 12/21/2017   CO2 32 12/21/2017   TSH 2.19 12/21/2017   INR 1.1 08/17/2007    Lab Results  Component Value Date   TSH 2.19 12/21/2017   Lab Results  Component Value Date   WBC 5.9 01/17/2018   HGB 16.3 (H) 01/17/2018   HCT 48.0 (H) 01/17/2018   MCV 89.4 01/17/2018   PLT 280 01/17/2018   Lab Results  Component Value Date   NA 139 12/21/2017   K 3.8 12/21/2017   CO2 32 12/21/2017   GLUCOSE 102 (H) 12/21/2017   BUN 13 12/21/2017   CREATININE 0.94 12/21/2017   BILITOT 0.6 12/21/2017   ALKPHOS 43 12/21/2017   AST 18 12/21/2017   ALT 25 12/21/2017   PROT 7.0 12/21/2017   ALBUMIN 4.4 12/21/2017   CALCIUM 9.8 12/21/2017   GFR 64.78 12/21/2017   Lab Results  Component Value Date   CHOL 208 (H) 12/21/2017   Lab Results  Component Value Date   HDL  73.00 12/21/2017   Lab Results  Component Value Date   LDLCALC 100 (H) 12/21/2017   Lab Results  Component Value Date   TRIG 173.0 (H) 12/21/2017   Lab Results  Component Value Date   CHOLHDL 3 12/21/2017   No results found for: HGBA1C     Assessment & Plan:   Problem List Items Addressed This Visit      Active Problems   Hypertension    History: Review: taking medications as instructed, no medication side effects noted, no TIAs, no chest pain on exertion, no dyspnea on exertion, no swelling of ankles. Smoker: No.  BP Readings from Last 3 Encounters:  10/11/18 132/74  08/07/18 130/76  01/31/18 127/85   Lab Results  Component Value Date   CREATININE 0.94 12/21/2017     Assessment/Plan: 1. Medication: no change. 2. Dietary sodium restriction. 3. Regular aerobic exercise.       Relevant Orders   Comp Met (CMET)   CBC w/Diff   Lipid Profile   TSH   VITAMIN D 25 Hydroxy (Vit-D Deficiency, Fractures)   Hyperlipidemia    Well controlled on current regimen. LDL has been at goal on current statin dose, patient has no side  effects from medication and LFTS are normal. No changes today. Check lipid panel today.      Relevant Orders   Comp Met (CMET)   CBC w/Diff   Lipid Profile   TSH   VITAMIN D 25 Hydroxy (Vit-D Deficiency, Fractures)   Depression    History: Current symptoms include none. Symptoms have been unchanged since that time. Patient denies anhedonia, depressed mood, difficulty concentrating, fatigue, feelings of worthlessness/guilt and hopelessness. Previous treatment includes: medication. She complains of the following side effects from the treatment: none.  Depression screen [facility] 2/9 12/21/2017 12/13/2016 09/18/2014  Decreased Interest 0 0 0  Down, Depressed, Hopeless 0 0 0  PHQ - 2 Score 0 0 0  Altered sleeping - 0 -  Tired, decreased energy - 0 -  Change in appetite - 0 -  Feeling bad or failure about yourself  - 0 -  Trouble concentrating - 0 -    Moving slowly or fidgety/restless - 0 -  Suicidal thoughts - 0 -  PHQ-9 Score - 0 -    Assessment/Plan:      4. Medications: Wellbutrin. 5. Labs: see orders. 6. List of counselors provided. 7. Instructed patient to contact office or on-call physician promptly should condition worsen or any new symptoms appear. IF THE PATIENT HAS ANY SUICIDAL OR HOMICIDAL IDEATIONS, CALL THE OFFICE, DISCUSS WITH A SUPPORT MEMBER, OR GO TO THE ER IMMEDIATELY. Patient was agreeable with this plan.       B12 deficiency   Relevant Orders   Vitamin B12   Well woman exam with routine gynecological exam - Primary    Reviewed preventive care protocols, scheduled due services, and updated immunizations Discussed nutrition, exercise, diet, and healthy lifestyle.  Mammogram ordered and phone number for breast center given to pt to schedule her own mammogram and DEXA in [place].  Pap smear performed today.        Other Visit Diagnoses    Vitamin D deficiency       Relevant Orders   Vitamin B12   Encounter for screening mammogram for malignant neoplasm of breast       Relevant Orders   MM Digital Screening   Estrogen deficiency       Relevant Orders   DG Bone Density      I am having Charmin D. Dross maintain her Calcium Carbonate-Vit D-Min (CALCIUM 1200 PO), Cranberry, vitamin B-12, Coenzyme Q10 (COQ-10 PO), aspirin EC, omeprazole, buPROPion, simvastatin, and hydrochlorothiazide.  No orders of the defined types were placed in this encounter.   This visit occurred during the SARS-CoV-2 public health emergency.  Safety protocols were in place, including screening questions prior to the visit, additional usage of staff PPE, and extensive cleaning of exam room while observing appropriate contact time as indicated for disinfecting solutions.    Arnette Norris, MD

## 2019-02-03 NOTE — Assessment & Plan Note (Signed)
Well controlled on current regimen. LDL has been at goal on current statin dose, patient has no side effects from medication and LFTS are normal. No changes today. Check lipid panel today.

## 2019-02-03 NOTE — Assessment & Plan Note (Addendum)
Reviewed preventive care protocols, scheduled due services, and updated immunizations Discussed nutrition, exercise, diet, and healthy lifestyle.  Mammogram ordered and phone number for breast center given to pt to schedule her own mammogram and DEXA in [place].  Pap smear performed today.

## 2019-02-03 NOTE — Assessment & Plan Note (Signed)
History: Review: taking medications as instructed, no medication side effects noted, no TIAs, no chest pain on exertion, no dyspnea on exertion, no swelling of ankles. Smoker: No.  BP Readings from Last 3 Encounters:  10/11/18 132/74  08/07/18 130/76  01/31/18 127/85   Lab Results  Component Value Date   CREATININE 0.94 12/21/2017     Assessment/Plan: 1. Medication: no change. 2. Dietary sodium restriction. 3. Regular aerobic exercise.

## 2019-02-03 NOTE — Assessment & Plan Note (Addendum)
History: Current symptoms include none. Symptoms have been unchanged since that time. Patient denies anhedonia, depressed mood, difficulty concentrating, fatigue, feelings of worthlessness/guilt and hopelessness. Previous treatment includes: medication. She complains of the following side effects from the treatment: none.  Depression screen [facility] 2/9 12/21/2017 12/13/2016 09/18/2014  Decreased Interest 0 0 0  Down, Depressed, Hopeless 0 0 0  PHQ - 2 Score 0 0 0  Altered sleeping - 0 -  Tired, decreased energy - 0 -  Change in appetite - 0 -  Feeling bad or failure about yourself  - 0 -  Trouble concentrating - 0 -  Moving slowly or fidgety/restless - 0 -  Suicidal thoughts - 0 -  PHQ-9 Score - 0 -    Assessment/Plan:      1. Medications: Wellbutrin. 2. Labs: see orders. 3. List of counselors provided. 4. Instructed patient to contact office or on-call physician promptly should condition worsen or any new symptoms appear. IF THE PATIENT HAS ANY SUICIDAL OR HOMICIDAL IDEATIONS, CALL THE OFFICE, DISCUSS WITH A SUPPORT MEMBER, OR GO TO THE ER IMMEDIATELY. Patient was agreeable with this plan.

## 2019-02-04 ENCOUNTER — Other Ambulatory Visit: Payer: Self-pay

## 2019-02-04 ENCOUNTER — Ambulatory Visit: Payer: BC Managed Care – PPO | Admitting: Family Medicine

## 2019-02-04 NOTE — Patient Instructions (Addendum)
Great to see you. I will call you with your lab results from today and you can view them online.   Please call the [facility] in [place] to schedule your mammogram and bone density scan.

## 2019-02-11 NOTE — Assessment & Plan Note (Signed)
Reviewed preventive care protocols, scheduled due services, and updated immunizations Discussed nutrition, exercise, diet, and healthy lifestyle.  Mammogram and DEXA ordered and phone number for breast center in [place] given to pt to schedule her own appointments.

## 2019-02-11 NOTE — Assessment & Plan Note (Signed)
History: Review: taking medications as instructed, no medication side effects noted, no TIAs, no chest pain on exertion, no dyspnea on exertion, no swelling of ankles. Smoker: No.  BP Readings from Last 3 Encounters:  10/11/18 132/74  08/07/18 130/76  01/31/18 127/85   Lab Results  Component Value Date   CREATININE 0.94 12/21/2017     Assessment/Plan: 1. Medication: no change. 2. Dietary sodium restriction. 3. Regular aerobic exercise.

## 2019-02-11 NOTE — Assessment & Plan Note (Signed)
Well controlled on current regimen. LDL is at goal on current statin dose, patient has no side effects from medication and LFTS are normal. No changes today.

## 2019-02-11 NOTE — Assessment & Plan Note (Addendum)
History: She currently takes Bupropion XL 150mg  1qd for depression.    Video Visit from 02/12/2019 in [facility]  PHQ-2 Total Score  0      Current symptoms include. Patient denies anhedonia, depressed mood and difficulty concentrating. Previous treatment includes: medication. She complains of the following side effects from the treatment: none. Assessment/Plan:      1. Medications: Wellbutrin 150 mg XL daily. 2. Labs: see orders. 3. List of counselors provided. 4. Instructed patient to contact office or on-call physician promptly should condition worsen or any new symptoms appear. IF THE PATIENT HAS ANY SUICIDAL OR HOMICIDAL IDEATIONS, CALL THE OFFICE, DISCUSS WITH A SUPPORT MEMBER, OR GO TO THE ER IMMEDIATELY. Patient was agreeable with this plan.

## 2019-02-11 NOTE — Progress Notes (Signed)
Virtual Visit via Video   Due to the COVID-19 pandemic, this visit was completed with telemedicine (audio/video) technology to reduce patient and provider exposure as well as to preserve personal protective equipment.   I connected with Hannah Ferguson by a video enabled telemedicine application and verified that I am speaking with the correct person using two identifiers. Location patient: Home Location provider: [facility], Office Persons participating in the virtual visit: Hannah Ferguson, Arnette Norris, MD   I discussed the limitations of evaluation and management by telemedicine and the availability of in person appointments. The patient expressed understanding and agreed to proceed.  Care Team   Patient Care Team: Lucille Passy, MD as PCP - General (Family Medicine)  Subjective:   HPI:  CPX and follow up of chronic medical conditions.  See problem based charting.  Review of Systems  Constitutional: Negative.  Negative for fever and malaise/fatigue.  HENT: Negative.  Negative for congestion and hearing loss.   Eyes: Negative.  Negative for blurred vision, discharge and redness.  Respiratory: Negative.  Negative for cough and shortness of breath.   Cardiovascular: Negative.  Negative for chest pain, palpitations and leg swelling.  Gastrointestinal: Negative.  Negative for abdominal pain and heartburn.  Genitourinary: Negative.  Negative for dysuria.  Musculoskeletal: Negative.  Negative for falls.  Skin: Negative.  Negative for rash.  Neurological: Negative.  Negative for loss of consciousness and headaches.  Endo/Heme/Allergies: Negative.  Does not bruise/bleed easily.  Psychiatric/Behavioral: Negative.  Negative for depression, hallucinations, memory loss, substance abuse and suicidal ideas. The patient is not nervous/anxious and does not have insomnia.   All other systems reviewed and are negative.    Patient Active Problem List   Diagnosis Date Noted  . Erythrocytosis  01/17/2018  . Well woman exam without gynecological exam 12/21/2017  . History of colonic polyps   . Benign neoplasm of descending colon   . Benign neoplasm of sigmoid colon   . B12 deficiency 05/08/2014  . History of recurrent UTIs 08/15/2013  . GERD (gastroesophageal reflux disease) 04/22/2013  . Depression 09/07/2011  . Hypertension   . Hyperlipidemia     Social History   Tobacco Use  . Smoking status: Former Smoker    Packs/day: 0.25    Years: 4.00    Pack years: 1.00    Types: Cigarettes    Quit date: 01/17/1985    Years since quitting: 34.0  . Smokeless tobacco: Never Used  Substance Use Topics  . Alcohol use: Yes    Alcohol/week: 2.0 standard drinks    Types: 2 Glasses of wine per week    Comment:      Current Outpatient Medications:  .  aspirin EC 81 MG tablet, Take 81 mg by mouth daily., Disp: , Rfl:  .  buPROPion (WELLBUTRIN XL) 150 MG 24 hr tablet, Take 1 tablet (150 mg total) by mouth every morning., Disp: 90 tablet, Rfl: 2 .  Calcium Carbonate-Vit D-Min (CALCIUM 1200 PO), Take 1 tablet by mouth daily.  , Disp: , Rfl:  .  Coenzyme Q10 (COQ-10 PO), Take 1 tablet by mouth daily. , Disp: , Rfl:  .  Cranberry 500 MG CAPS, Take one by mouth daily, Disp: , Rfl:  .  cyanocobalamin 500 MCG tablet, Take 500 mcg by mouth daily., Disp: , Rfl:  .  hydrochlorothiazide (HYDRODIURIL) 25 MG tablet, TAKE 1 TABLET BY MOUTH DAILY, Disp: 30 tablet, Rfl: 0 .  omeprazole (PRILOSEC) 20 MG capsule, Take 1 capsule (  20 mg total) by mouth daily., Disp: 90 capsule, Rfl: 2 .  simvastatin (ZOCOR) 10 MG tablet, TAKE 1 TABLET BY MOUTH AT BEDTIME, Disp: 90 tablet, Rfl: 2  Allergies  Allergen Reactions  . Sulfa Antibiotics Rash    Objective:  Wt 216 lb (98 kg)   BMI 38.26 kg/m    Wt Readings from Last 3 Encounters:  02/12/19 216 lb (98 kg)  10/11/18 218 lb (98.9 kg)  08/07/18 217 lb (98.4 kg)    BP Readings from Last 3 Encounters:  10/11/18 132/74  08/07/18 130/76  01/31/18 127/85     VITALS: Per patient if applicable, see vitals. GENERAL: Alert, appears well and in no acute distress. HEENT: Atraumatic, conjunctiva clear, no obvious abnormalities on inspection of external nose and ears. NECK: Normal movements of the head and neck. CARDIOPULMONARY: No increased WOB. Speaking in clear sentences. I:E ratio WNL.  MS: Moves all visible extremities without noticeable abnormality. PSYCH: Pleasant and cooperative, well-groomed. Speech normal rate and rhythm. Affect is appropriate. Insight and judgement are appropriate. Attention is focused, linear, and appropriate.  NEURO: CN grossly intact. Oriented as arrived to appointment on time with no prompting. Moves both UE equally.  SKIN: No obvious lesions, wounds, erythema, or cyanosis noted on face or hands.  Depression screen [facility] 2/9 12/21/2017 12/13/2016 09/18/2014  Decreased Interest 0 0 0  Down, Depressed, Hopeless 0 0 0  PHQ - 2 Score 0 0 0  Altered sleeping - 0 -  Tired, decreased energy - 0 -  Change in appetite - 0 -  Feeling bad or failure about yourself  - 0 -  Trouble concentrating - 0 -  Moving slowly or fidgety/restless - 0 -  Suicidal thoughts - 0 -  PHQ-9 Score - 0 -     . COVID-19 Education: The signs and symptoms of COVID-19 were discussed with the patient and how to seek care for testing if needed. The importance of social distancing was discussed today. . Reviewed expectations re: course of current medical issues. . Discussed self-management of symptoms. . Outlined signs and symptoms indicating need for more acute intervention. . Patient verbalized understanding and all questions were answered. [facility] Health Maintenance issues including appropriate healthy diet, exercise, and smoking avoidance were discussed with patient. . See orders for this visit as documented in the electronic medical record.  Arnette Norris, MD    Lab Results  Component Value Date   WBC 5.9 01/17/2018   HGB 16.3 (H) 01/17/2018   HCT  48.0 (H) 01/17/2018   PLT 280 01/17/2018   GLUCOSE 102 (H) 12/21/2017   CHOL 208 (H) 12/21/2017   TRIG 173.0 (H) 12/21/2017   HDL 73.00 12/21/2017   LDLDIRECT 152.8 07/26/2012   LDLCALC 100 (H) 12/21/2017   ALT 25 12/21/2017   AST 18 12/21/2017   NA 139 12/21/2017   K 3.8 12/21/2017   CL 101 12/21/2017   CREATININE 0.94 12/21/2017   BUN 13 12/21/2017   CO2 32 12/21/2017   TSH 2.19 12/21/2017   INR 1.1 08/17/2007    Lab Results  Component Value Date   TSH 2.19 12/21/2017   Lab Results  Component Value Date   WBC 5.9 01/17/2018   HGB 16.3 (H) 01/17/2018   HCT 48.0 (H) 01/17/2018   MCV 89.4 01/17/2018   PLT 280 01/17/2018   Lab Results  Component Value Date   NA 139 12/21/2017   K 3.8 12/21/2017   CO2 32 12/21/2017   GLUCOSE 102 (H)  12/21/2017   BUN 13 12/21/2017   CREATININE 0.94 12/21/2017   BILITOT 0.6 12/21/2017   ALKPHOS 43 12/21/2017   AST 18 12/21/2017   ALT 25 12/21/2017   PROT 7.0 12/21/2017   ALBUMIN 4.4 12/21/2017   CALCIUM 9.8 12/21/2017   GFR 64.78 12/21/2017   Lab Results  Component Value Date   CHOL 208 (H) 12/21/2017   Lab Results  Component Value Date   HDL 73.00 12/21/2017   Lab Results  Component Value Date   LDLCALC 100 (H) 12/21/2017   Lab Results  Component Value Date   TRIG 173.0 (H) 12/21/2017   Lab Results  Component Value Date   CHOLHDL 3 12/21/2017   No results found for: HGBA1C     Assessment & Plan:   Problem List Items Addressed This Visit      Active Problems   Hypertension    History: Review: taking medications as instructed, no medication side effects noted, no TIAs, no chest pain on exertion, no dyspnea on exertion, no swelling of ankles. Smoker: No.  BP Readings from Last 3 Encounters:  10/11/18 132/74  08/07/18 130/76  01/31/18 127/85   Lab Results  Component Value Date   CREATININE 0.94 12/21/2017     Assessment/Plan: 1. Medication: no change. 2. Dietary sodium restriction. 3. Regular  aerobic exercise.       Hyperlipidemia    Well controlled on current regimen. LDL is at goal on current statin dose, patient has no side effects from medication and LFTS are normal. No changes today.       Depression    History: She currently takes Bupropion XL 150mg  1qd for depression.  Current symptoms include. Patient denies anhedonia, depressed mood and difficulty concentrating. Previous treatment includes: medication. She complains of the following side effects from the treatment: none. Assessment/Plan:      4. Medications: Wellbutrin 150 mg XL daily. 5. Labs: see orders. 6. List of counselors provided. 7. Instructed patient to contact office or on-call physician promptly should condition worsen or any new symptoms appear. IF THE PATIENT HAS ANY SUICIDAL OR HOMICIDAL IDEATIONS, CALL THE OFFICE, DISCUSS WITH A SUPPORT MEMBER, OR GO TO THE ER IMMEDIATELY. Patient was agreeable with this plan.       B12 deficiency   Well woman exam without gynecological exam - Primary    Reviewed preventive care protocols, scheduled due services, and updated immunizations Discussed nutrition, exercise, diet, and healthy lifestyle.  Mammogram and DEXA ordered and phone number for breast center in [place] given to pt to schedule her own appointments.          I am having Hannah Ferguson maintain her Calcium Carbonate-Vit D-Min (CALCIUM 1200 PO), Cranberry, vitamin B-12, Coenzyme Q10 (COQ-10 PO), aspirin EC, omeprazole, buPROPion, simvastatin, and hydrochlorothiazide.  No orders of the defined types were placed in this encounter.    Arnette Norris, MD

## 2019-02-12 ENCOUNTER — Telehealth (INDEPENDENT_AMBULATORY_CARE_PROVIDER_SITE_OTHER): Payer: BC Managed Care – PPO | Admitting: Family Medicine

## 2019-02-12 ENCOUNTER — Encounter: Payer: Self-pay | Admitting: Family Medicine

## 2019-02-12 VITALS — Wt 216.0 lb

## 2019-02-12 DIAGNOSIS — E538 Deficiency of other specified B group vitamins: Secondary | ICD-10-CM | POA: Diagnosis not present

## 2019-02-12 DIAGNOSIS — E7800 Pure hypercholesterolemia, unspecified: Secondary | ICD-10-CM

## 2019-02-12 DIAGNOSIS — Z0000 Encounter for general adult medical examination without abnormal findings: Secondary | ICD-10-CM

## 2019-02-12 DIAGNOSIS — I10 Essential (primary) hypertension: Secondary | ICD-10-CM | POA: Diagnosis not present

## 2019-02-12 DIAGNOSIS — F320 Major depressive disorder, single episode, mild: Secondary | ICD-10-CM

## 2019-02-12 MED ORDER — HYDROCHLOROTHIAZIDE 25 MG PO TABS: 25.0000 mg | ORAL_TABLET | Freq: Every day | ORAL | 3 refills | Status: DC

## 2019-02-12 MED ORDER — SIMVASTATIN 10 MG PO TABS: ORAL_TABLET | ORAL | 3 refills | Status: DC

## 2019-02-12 MED ORDER — BUPROPION HCL ER (XL) 150 MG PO TB24: 150.0000 mg | ORAL_TABLET | Freq: Every morning | ORAL | 3 refills | Status: DC

## 2019-02-15 ENCOUNTER — Other Ambulatory Visit: Payer: BC Managed Care – PPO

## 2019-02-19 ENCOUNTER — Other Ambulatory Visit: Payer: Self-pay | Admitting: Family Medicine

## 2019-02-19 DIAGNOSIS — Z1231 Encounter for screening mammogram for malignant neoplasm of breast: Secondary | ICD-10-CM

## 2019-02-20 ENCOUNTER — Other Ambulatory Visit: Payer: Self-pay

## 2019-02-20 ENCOUNTER — Other Ambulatory Visit: Payer: Self-pay | Admitting: Family Medicine

## 2019-02-20 DIAGNOSIS — K2100 Gastro-esophageal reflux disease with esophagitis, without bleeding: Secondary | ICD-10-CM

## 2019-02-20 DIAGNOSIS — I10 Essential (primary) hypertension: Secondary | ICD-10-CM

## 2019-02-21 ENCOUNTER — Telehealth: Payer: Self-pay | Admitting: Family Medicine

## 2019-02-21 ENCOUNTER — Telehealth: Payer: Self-pay

## 2019-02-21 ENCOUNTER — Other Ambulatory Visit (INDEPENDENT_AMBULATORY_CARE_PROVIDER_SITE_OTHER): Payer: BC Managed Care – PPO

## 2019-02-21 DIAGNOSIS — E7800 Pure hypercholesterolemia, unspecified: Secondary | ICD-10-CM

## 2019-02-21 DIAGNOSIS — E538 Deficiency of other specified B group vitamins: Secondary | ICD-10-CM

## 2019-02-21 DIAGNOSIS — Z0000 Encounter for general adult medical examination without abnormal findings: Secondary | ICD-10-CM

## 2019-02-21 DIAGNOSIS — I10 Essential (primary) hypertension: Secondary | ICD-10-CM

## 2019-02-21 DIAGNOSIS — E559 Vitamin D deficiency, unspecified: Secondary | ICD-10-CM | POA: Diagnosis not present

## 2019-02-21 LAB — CBC WITH DIFFERENTIAL/PLATELET
Basophils Absolute: 0.1 10*3/uL (ref 0.0–0.1)
Basophils Relative: 1.1 % (ref 0.0–3.0)
Eosinophils Absolute: 0.2 10*3/uL (ref 0.0–0.7)
Eosinophils Relative: 3.4 % (ref 0.0–5.0)
HCT: 44.6 % (ref 36.0–46.0)
Hemoglobin: 15 g/dL (ref 12.0–15.0)
Lymphocytes Relative: 46.3 % — ABNORMAL HIGH (ref 12.0–46.0)
Lymphs Abs: 2.9 10*3/uL (ref 0.7–4.0)
MCHC: 33.5 g/dL (ref 30.0–36.0)
MCV: 90.9 fl (ref 78.0–100.0)
Monocytes Absolute: 0.5 10*3/uL (ref 0.1–1.0)
Monocytes Relative: 7.7 % (ref 3.0–12.0)
Neutro Abs: 2.6 10*3/uL (ref 1.4–7.7)
Neutrophils Relative %: 41.5 % — ABNORMAL LOW (ref 43.0–77.0)
Platelets: 253 10*3/uL (ref 150.0–400.0)
RBC: 4.91 Mil/uL (ref 3.87–5.11)
RDW: 12.7 % (ref 11.5–15.5)
WBC: 6.2 10*3/uL (ref 4.0–10.5)

## 2019-02-21 LAB — COMPREHENSIVE METABOLIC PANEL
ALT: 22 U/L (ref 0–35)
AST: 16 U/L (ref 0–37)
Albumin: 4.1 g/dL (ref 3.5–5.2)
Alkaline Phosphatase: 41 U/L (ref 39–117)
BUN: 15 mg/dL (ref 6–23)
CO2: 32 mEq/L (ref 19–32)
Calcium: 9.6 mg/dL (ref 8.4–10.5)
Chloride: 103 mEq/L (ref 96–112)
Creatinine, Ser: 1.02 mg/dL (ref 0.40–1.20)
GFR: 55.24 mL/min — ABNORMAL LOW (ref >60.00)
Glucose, Bld: 102 mg/dL — ABNORMAL HIGH (ref 70–99)
Potassium: 3.7 mEq/L (ref 3.5–5.1)
Sodium: 140 mEq/L (ref 135–145)
Total Bilirubin: 0.6 mg/dL (ref 0.2–1.2)
Total Protein: 6.5 g/dL (ref 6.0–8.3)

## 2019-02-21 LAB — LIPID PANEL
Cholesterol: 182 mg/dL (ref 0–200)
HDL: 65.5 mg/dL (ref >39.00)
LDL Cholesterol: 92 mg/dL (ref 0–99)
NonHDL: 116.92
Total CHOL/HDL Ratio: 3
Triglycerides: 123 mg/dL (ref 0.0–149.0)
VLDL: 24.6 mg/dL (ref 0.0–40.0)

## 2019-02-21 LAB — TSH: TSH: 2.56 u[IU]/mL (ref 0.35–4.50)

## 2019-02-21 LAB — VITAMIN D 25 HYDROXY (VIT D DEFICIENCY, FRACTURES): VITD: 16.65 ng/mL — ABNORMAL LOW (ref 30.00–100.00)

## 2019-02-21 LAB — VITAMIN B12: Vitamin B-12: 735 pg/mL (ref 211–911)

## 2019-02-21 MED ORDER — VITAMIN D (ERGOCALCIFEROL) 1.25 MG (50000 UNIT) PO CAPS: 50000.0000 [IU] | ORAL_CAPSULE | ORAL | 0 refills | Status: AC

## 2019-02-21 NOTE — Telephone Encounter (Signed)
-----   Message from Lucille Passy, MD sent at 02/21/2019  3:44 PM EST ----- Please call pt- 1.   Vitamin Hannah Ferguson is low which can make you very tired and achy.  Please call in Vit D3 50,000 units x 12 weeks - 1 tab po weekly x 12 weeks (number 12, no refills), then continue VitD 2000 IU daily.  Recheck in 8-12 weeks.  2.  Kidney function a little lower- has she been dehydrated?  Taking NSAIDS?  Make sure new PCP rechecks BMET in a couple of weeks.

## 2019-02-21 NOTE — Telephone Encounter (Signed)
LMOVM that an Rx for a vitamin was sent to pharmacy and that I would be sending her a message via her MyChart and to please respond/created future order/thx dmf

## 2019-02-21 NOTE — Telephone Encounter (Signed)
Patient is requesting a TOC from Dr. Deborra Medina to Dr. Bryan Lemma. Please advise.

## 2019-02-28 ENCOUNTER — Ambulatory Visit: Payer: BC Managed Care – PPO

## 2019-02-28 ENCOUNTER — Inpatient Hospital Stay: Admission: RE | Admit: 2019-02-28 | Payer: BC Managed Care – PPO | Source: Ambulatory Visit

## 2019-02-28 DIAGNOSIS — Z20828 Contact with and (suspected) exposure to other viral communicable diseases: Secondary | ICD-10-CM | POA: Diagnosis not present

## 2019-03-05 NOTE — Telephone Encounter (Signed)
MC-Pt is requesting to schedule TOC to you/plz advise/thx dmf

## 2019-03-06 NOTE — Telephone Encounter (Signed)
Plz sched pt a TOC visit with Dr. C/thx dmf

## 2019-03-06 NOTE — Telephone Encounter (Signed)
Ok with me 

## 2019-03-07 ENCOUNTER — Encounter: Payer: Self-pay | Admitting: Family Medicine

## 2019-03-07 ENCOUNTER — Ambulatory Visit
Admission: RE | Admit: 2019-03-07 | Discharge: 2019-03-07 | Disposition: A | Discharge status: Home / Self Care | Payer: BC Managed Care – PPO | Source: Ambulatory Visit | Attending: Family Medicine | Admitting: Family Medicine

## 2019-03-07 ENCOUNTER — Telehealth (INDEPENDENT_AMBULATORY_CARE_PROVIDER_SITE_OTHER): Payer: BC Managed Care – PPO | Admitting: Family Medicine

## 2019-03-07 ENCOUNTER — Other Ambulatory Visit: Payer: Self-pay

## 2019-03-07 DIAGNOSIS — E2839 Other primary ovarian failure: Secondary | ICD-10-CM

## 2019-03-07 DIAGNOSIS — G479 Sleep disorder, unspecified: Secondary | ICD-10-CM | POA: Diagnosis not present

## 2019-03-07 DIAGNOSIS — Z13820 Encounter for screening for osteoporosis: Secondary | ICD-10-CM | POA: Diagnosis not present

## 2019-03-07 DIAGNOSIS — Z1231 Encounter for screening mammogram for malignant neoplasm of breast: Secondary | ICD-10-CM | POA: Insufficient documentation

## 2019-03-07 DIAGNOSIS — F330 Major depressive disorder, recurrent, mild: Secondary | ICD-10-CM | POA: Diagnosis not present

## 2019-03-07 DIAGNOSIS — Z780 Asymptomatic menopausal state: Secondary | ICD-10-CM | POA: Diagnosis not present

## 2019-03-07 MED ORDER — ALPRAZOLAM 0.5 MG PO TABS: 0.5000 mg | ORAL_TABLET | Freq: Every evening | ORAL | 0 refills | Status: DC | PRN

## 2019-03-07 NOTE — Progress Notes (Signed)
Virtual Visit via Video Note  I connected with Hannah Ferguson on 03/07/19 at  2:00 PM EST by a video enabled telemedicine application and verified that I am speaking with the correct person using two identifiers. Location patient: home Location provider: home office Persons participating in the virtual visit: patient, provider  I discussed the limitations of evaluation and management by telemedicine and the availability of in person appointments. The patient expressed understanding and agreed to proceed.  Chief Complaint  Patient presents with  . Depression     HPI: Hannah Ferguson is a 59 y.o. female who is a former patient of Dr. Deborra Medina who has been on Wellbutrin x 7-8 years. She is on wellbutrin 150mg  XL. She would like to increase dose to see if that helps improve her increased depression and anxiety symptoms. Denies SI.  She also requests a new Rx for xanax 0.5mg  qHS PRN which was Rx'd in 2018 and tabs are now expired. She takes very rarely for issues sleeping.   Past Medical History:  Diagnosis Date  . Depression   . GERD (gastroesophageal reflux disease)   . Heartburn   . Hyperlipidemia   . Hypertension    Pt denies  . Wears contact lenses     Past Surgical History:  Procedure Laterality Date  . COLONOSCOPY WITH PROPOFOL N/A 10/02/2015   Procedure: COLONOSCOPY WITH PROPOFOL;  Surgeon: Lucilla Lame, MD;  Location: [facility];  Service: Endoscopy;  Laterality: N/A;  . KNEE SURGERY    . POLYPECTOMY  10/02/2015   Procedure: POLYPECTOMY;  Surgeon: Lucilla Lame, MD;  Location: [place] [facility];  Service: Endoscopy;;  . TONSILLECTOMY  2006    Family History  Problem Relation Age of Onset  . Multiple sclerosis Mother   . Cancer Father 19       stomach cancer died age 43  . Breast cancer Maternal Grandmother 84  . Drug abuse Brother   . Kidney disease Neg Hx   . Bladder Cancer Neg Hx     Social History   Tobacco Use  . Smoking status: Former Smoker    Packs/day:  0.25    Years: 4.00    Pack years: 1.00    Types: Cigarettes    Quit date: 01/17/1985    Years since quitting: 34.1  . Smokeless tobacco: Never Used  Substance Use Topics  . Alcohol use: Yes    Alcohol/week: 2.0 standard drinks    Types: 2 Glasses of wine per week    Comment:    . Drug use: No     Current Outpatient Medications:  .  ALPRAZolam (XANAX) 0.5 MG tablet, Take 1 tablet (0.5 mg total) by mouth at bedtime as needed for anxiety., Disp: 30 tablet, Rfl: 0 .  aspirin EC 81 MG tablet, Take 81 mg by mouth daily., Disp: , Rfl:  .  buPROPion (WELLBUTRIN XL) 150 MG 24 hr tablet, TAKE 1 TABLET BY MOUTH EVERY MORNING. GENERIC EQUIVALENT FOR WELLBUTRIN XL, Disp: 90 tablet, Rfl: 3 .  Calcium Carbonate-Vit D-Min (CALCIUM 1200 PO), Take 1 tablet by mouth daily.  , Disp: , Rfl:  .  Coenzyme Q10 (COQ-10 PO), Take 1 tablet by mouth daily. , Disp: , Rfl:  .  Cranberry 500 MG CAPS, Take one by mouth daily, Disp: , Rfl:  .  cyanocobalamin 500 MCG tablet, Take 500 mcg by mouth daily., Disp: , Rfl:  .  hydrochlorothiazide (HYDRODIURIL) 25 MG tablet, TAKE 1 TABLET BY MOUTH DAILY., Disp: 90  tablet, Rfl: 3 .  omeprazole (PRILOSEC) 20 MG capsule, TAKE ONE CAPSULE BY MOUTH DAILY, Disp: 90 capsule, Rfl: 2 .  simvastatin (ZOCOR) 10 MG tablet, TAKE 1 TABLET BY MOUTH AT BEDTIME, Disp: 90 tablet, Rfl: 3 .  Vitamin D, Ergocalciferol, (DRISDOL) 1.25 MG (50000 UNIT) CAPS capsule, Take 1 capsule (50,000 Units total) by mouth every 7 (seven) days., Disp: 12 capsule, Rfl: 0  Allergies  Allergen Reactions  . Sulfa Antibiotics Rash      ROS: See pertinent positives and negatives per HPI.   EXAM:  VITALS per patient if applicable: There were no vitals taken for this visit.   GENERAL: alert, oriented, appears well and in no acute distress  HEENT: atraumatic, conjunctiva clear, no obvious abnormalities on inspection of external nose and ears  NECK: normal movements of the head and neck  LUNGS: on  inspection no signs of respiratory distress, breathing rate appears normal, no obvious gross SOB, gasping or wheezing, no conversational dyspnea  CV: no obvious cyanosis  PSYCH/NEURO: pleasant and cooperative, speech and thought processing grossly intact   ASSESSMENT AND PLAN: 1. Mild episode of recurrent major depressive disorder (HCC) - increase wellbutrin from 150mg  to 300mg  daily - regular exercise, healthy diet, adequate sleep - f/u in 4-6 wks via MyChart or sooner PRN  2. Sleep disturbance - controled substance database reviewed and appropriate - pt last Rx'd xanax in 2018 Refill: - ALPRAZolam (XANAX) 0.5 MG tablet; Take 1 tablet (0.5 mg total) by mouth at bedtime as needed for anxiety.  Dispense: 30 tablet; Refill: 0  I discussed the assessment and treatment plan with the patient. The patient was provided an opportunity to ask questions and all were answered. The patient agreed with the plan and demonstrated an understanding of the instructions.   The patient was advised to call back or seek an in-person evaluation if the symptoms worsen or if the condition fails to improve as anticipated.   Letta Median, DO

## 2019-03-08 NOTE — Telephone Encounter (Signed)
Pt had appt already.

## 2019-07-23 ENCOUNTER — Encounter: Payer: Self-pay | Admitting: Family Medicine

## 2019-07-23 DIAGNOSIS — I10 Essential (primary) hypertension: Secondary | ICD-10-CM

## 2019-07-23 MED ORDER — HYDROCHLOROTHIAZIDE 25 MG PO TABS: 25.0000 mg | ORAL_TABLET | Freq: Every day | ORAL | 0 refills | Status: DC

## 2019-09-10 ENCOUNTER — Encounter: Payer: Self-pay | Admitting: Family Medicine

## 2019-10-02 ENCOUNTER — Telehealth: Payer: Self-pay | Admitting: Family Medicine

## 2019-10-02 NOTE — Telephone Encounter (Signed)
Pt call [facility].  Trying to get a hold of dr Bryan Lemma.  She is quarantine in [place]  Tested positive 9/18 for covid. [employer] is stating she has to have a neg test before she can board plane  She needs advise on what to do.  She have no symptoms.   They did rapid antigen test  This was done so she could board plane  She stated she has to stay there until 9/30.

## 2019-10-02 NOTE — Telephone Encounter (Signed)
Unfortunately, there is not much I can do. She will have to do her 10 day quarantine (from the date of positive test since she is asymptomatic) and then should be ok to fly. I guess [employer] has their own policy of requiring negative test after that time. I would suggest pt ask for PCR test at that time.

## 2019-10-03 NOTE — Telephone Encounter (Signed)
Pt was notified and was informed there was nothing Dr. Loletha Grayer could do until she had a negative Covid test to fly per CDC and [employer] policy for flying back home from [place], I told pt that if she does start having any symptoms or has any questions or concerns to call us back, pt did state shes still asymptomatic and fully vaccinated.

## 2019-10-03 NOTE — Telephone Encounter (Signed)
Unable to leave a message for pt but sent a My Chart message informing her of Dr. Lurline Del message an if she had any further questions or concerns to let us know.

## 2019-10-13 ENCOUNTER — Encounter: Payer: Self-pay | Admitting: Family Medicine

## 2019-10-13 DIAGNOSIS — E559 Vitamin D deficiency, unspecified: Secondary | ICD-10-CM

## 2019-10-21 ENCOUNTER — Other Ambulatory Visit: Payer: Self-pay

## 2019-10-21 ENCOUNTER — Other Ambulatory Visit (INDEPENDENT_AMBULATORY_CARE_PROVIDER_SITE_OTHER): Payer: BC Managed Care – PPO

## 2019-10-21 DIAGNOSIS — E559 Vitamin D deficiency, unspecified: Secondary | ICD-10-CM

## 2019-10-21 LAB — VITAMIN D 25 HYDROXY (VIT D DEFICIENCY, FRACTURES): VITD: 26.03 ng/mL — ABNORMAL LOW (ref 30.00–100.00)

## 2019-10-31 DIAGNOSIS — D2271 Melanocytic nevi of right lower limb, including hip: Secondary | ICD-10-CM | POA: Diagnosis not present

## 2019-10-31 DIAGNOSIS — D225 Melanocytic nevi of trunk: Secondary | ICD-10-CM | POA: Diagnosis not present

## 2019-10-31 DIAGNOSIS — C4441 Basal cell carcinoma of skin of scalp and neck: Secondary | ICD-10-CM | POA: Diagnosis not present

## 2019-10-31 DIAGNOSIS — Z85828 Personal history of other malignant neoplasm of skin: Secondary | ICD-10-CM | POA: Diagnosis not present

## 2019-10-31 DIAGNOSIS — L814 Other melanin hyperpigmentation: Secondary | ICD-10-CM | POA: Diagnosis not present

## 2020-01-01 DIAGNOSIS — H1089 Other conjunctivitis: Secondary | ICD-10-CM | POA: Diagnosis not present

## 2020-01-06 ENCOUNTER — Encounter: Payer: Self-pay | Admitting: Family Medicine

## 2020-01-08 ENCOUNTER — Other Ambulatory Visit: Payer: Self-pay

## 2020-01-08 DIAGNOSIS — K2100 Gastro-esophageal reflux disease with esophagitis, without bleeding: Secondary | ICD-10-CM

## 2020-01-08 MED ORDER — OMEPRAZOLE 20 MG PO CPDR: 20.0000 mg | DELAYED_RELEASE_CAPSULE | Freq: Every day | ORAL | 0 refills | Status: DC

## 2020-01-11 DIAGNOSIS — C4491 Basal cell carcinoma of skin, unspecified: Secondary | ICD-10-CM

## 2020-01-11 HISTORY — DX: Basal cell carcinoma of skin, unspecified: C44.91

## 2020-01-16 ENCOUNTER — Encounter: Payer: Self-pay | Admitting: Family Medicine

## 2020-02-04 DIAGNOSIS — C4441 Basal cell carcinoma of skin of scalp and neck: Secondary | ICD-10-CM | POA: Diagnosis not present

## 2020-03-17 NOTE — Patient Instructions (Addendum)
Health Maintenance Due  Topic Date Due  . COVID-19 Vaccine (1) Never done  . PAP SMEAR-Modifier  11/05/2018  . INFLUENZA VACCINE  08/11/2019  . MAMMOGRAM  03/06/2020    Depression screen [facility] 2/9 02/12/2019 12/21/2017 12/13/2016  Decreased Interest 0 0 0  Down, Depressed, Hopeless 0 0 0  PHQ - 2 Score 0 0 0  Altered sleeping - - 0  Tired, decreased energy - - 0  Change in appetite - - 0  Feeling bad or failure about yourself  - - 0  Trouble concentrating - - 0  Moving slowly or fidgety/restless - - 0  Suicidal thoughts - - 0  PHQ-9 Score - - 0   Check your BP 3-4x/wk x 2 wks and send mychart message with readings after 2 wks Goal under 140/under 90

## 2020-03-20 ENCOUNTER — Ambulatory Visit (INDEPENDENT_AMBULATORY_CARE_PROVIDER_SITE_OTHER): Payer: BC Managed Care – PPO | Admitting: Family Medicine

## 2020-03-20 ENCOUNTER — Other Ambulatory Visit: Payer: Self-pay

## 2020-03-20 ENCOUNTER — Encounter: Payer: Self-pay | Admitting: Family Medicine

## 2020-03-20 ENCOUNTER — Other Ambulatory Visit (HOSPITAL_COMMUNITY)
Admission: RE | Admit: 2020-03-20 | Discharge: 2020-03-20 | Disposition: A | Discharge status: Home / Self Care | Payer: BC Managed Care – PPO | Source: Ambulatory Visit | Attending: Family Medicine | Admitting: Family Medicine

## 2020-03-20 VITALS — BP 130/98 | HR 74 | Temp 97.2°F | Ht 62.25 in | Wt 221.6 lb

## 2020-03-20 DIAGNOSIS — I10 Essential (primary) hypertension: Secondary | ICD-10-CM | POA: Diagnosis not present

## 2020-03-20 DIAGNOSIS — Z124 Encounter for screening for malignant neoplasm of cervix: Secondary | ICD-10-CM | POA: Insufficient documentation

## 2020-03-20 DIAGNOSIS — E538 Deficiency of other specified B group vitamins: Secondary | ICD-10-CM | POA: Diagnosis not present

## 2020-03-20 DIAGNOSIS — Z0000 Encounter for general adult medical examination without abnormal findings: Secondary | ICD-10-CM

## 2020-03-20 DIAGNOSIS — R4189 Other symptoms and signs involving cognitive functions and awareness: Secondary | ICD-10-CM

## 2020-03-20 DIAGNOSIS — E7800 Pure hypercholesterolemia, unspecified: Secondary | ICD-10-CM

## 2020-03-20 DIAGNOSIS — Z1231 Encounter for screening mammogram for malignant neoplasm of breast: Secondary | ICD-10-CM

## 2020-03-20 DIAGNOSIS — K2100 Gastro-esophageal reflux disease with esophagitis, without bleeding: Secondary | ICD-10-CM

## 2020-03-20 DIAGNOSIS — E559 Vitamin D deficiency, unspecified: Secondary | ICD-10-CM

## 2020-03-20 LAB — BASIC METABOLIC PANEL
BUN: 16 mg/dL (ref 6–23)
CO2: 31 mEq/L (ref 19–32)
Calcium: 9.6 mg/dL (ref 8.4–10.5)
Chloride: 102 mEq/L (ref 96–112)
Creatinine, Ser: 1.05 mg/dL (ref 0.40–1.20)
GFR: 57.45 mL/min — ABNORMAL LOW (ref >60.00)
Glucose, Bld: 101 mg/dL — ABNORMAL HIGH (ref 70–99)
Potassium: 3.6 mEq/L (ref 3.5–5.1)
Sodium: 141 mEq/L (ref 135–145)

## 2020-03-20 LAB — CBC
HCT: 45.9 % (ref 36.0–46.0)
Hemoglobin: 15.9 g/dL — ABNORMAL HIGH (ref 12.0–15.0)
MCHC: 34.8 g/dL (ref 30.0–36.0)
MCV: 88.6 fl (ref 78.0–100.0)
Platelets: 271 10*3/uL (ref 150.0–400.0)
RBC: 5.18 Mil/uL — ABNORMAL HIGH (ref 3.87–5.11)
RDW: 12.7 % (ref 11.5–15.5)
WBC: 6.8 10*3/uL (ref 4.0–10.5)

## 2020-03-20 LAB — LIPID PANEL
Cholesterol: 198 mg/dL (ref 0–200)
HDL: 72 mg/dL (ref >39.00)
LDL Cholesterol: 100 mg/dL — ABNORMAL HIGH (ref 0–99)
NonHDL: 125.74
Total CHOL/HDL Ratio: 3
Triglycerides: 131 mg/dL (ref 0.0–149.0)
VLDL: 26.2 mg/dL (ref 0.0–40.0)

## 2020-03-20 LAB — VITAMIN B12: Vitamin B-12: >1506 pg/mL — ABNORMAL HIGH (ref 211–911)

## 2020-03-20 LAB — VITAMIN D 25 HYDROXY (VIT D DEFICIENCY, FRACTURES): VITD: 42.12 ng/mL (ref 30.00–100.00)

## 2020-03-20 LAB — AST: AST: 17 U/L (ref 0–37)

## 2020-03-20 LAB — ALT: ALT: 22 U/L (ref 0–35)

## 2020-03-20 MED ORDER — PANTOPRAZOLE SODIUM 20 MG PO TBEC: 20.0000 mg | DELAYED_RELEASE_TABLET | Freq: Every day | ORAL | 3 refills | Status: DC

## 2020-03-20 NOTE — Addendum Note (Signed)
Addended by: Ronnald Nian on: 03/20/2020 12:53 PM   Modules accepted: Orders

## 2020-03-20 NOTE — Progress Notes (Signed)
Hannah Ferguson is a 60 y.o. female  Chief Complaint  Patient presents with  . Annual Exam    Pt is fasting for labs. Pt need a referral for mammogram, [place] office.  Pt is do for pap smear.  Pt would like to discuss memory/focus.    HPI: Hannah Ferguson is a 60 y.o. female seen today for annual CPE, fasting labs.   Last PAP: 10/2015 - due Last mammo: 02/2019 Last Dexa: 02/2019 - normal, T-score = -1.0 Last colonoscopy: 09/2015 - Dr. Lucilla Lame at [facility] - due in 2022  Dental: UTD Vision: UTD  Med refills needed today? none  Past Medical History:  Diagnosis Date  . Depression   . GERD (gastroesophageal reflux disease)   . Heartburn   . Hyperlipidemia   . Hypertension    Pt denies  . Wears contact lenses     Past Surgical History:  Procedure Laterality Date  . COLONOSCOPY WITH PROPOFOL N/A 10/02/2015   Procedure: COLONOSCOPY WITH PROPOFOL;  Surgeon: Lucilla Lame, MD;  Location: [facility];  Service: Endoscopy;  Laterality: N/A;  . KNEE SURGERY    . POLYPECTOMY  10/02/2015   Procedure: POLYPECTOMY;  Surgeon: Lucilla Lame, MD;  Location: [facility];  Service: Endoscopy;;  . TONSILLECTOMY  2006    Social History   Socioeconomic History  . Marital status: Married    Spouse name: Not on file  . Number of children: Not on file  . Years of education: Not on file  . Highest education level: Not on file  Occupational History  . Not on file  Tobacco Use  . Smoking status: Former Smoker    Packs/day: 0.25    Years: 4.00    Pack years: 1.00    Types: Cigarettes    Quit date: 01/17/1985    Years since quitting: 35.1  . Smokeless tobacco: Never Used  Vaping Use  . Vaping Use: Never used  Substance and Sexual Activity  . Alcohol use: Yes    Alcohol/week: 2.0 standard drinks    Types: 2 Glasses of wine per week    Comment:    . Drug use: No  . Sexual activity: Not Currently  Other Topics Concern  . Not on file  Social History Narrative  . Not on file    Social Determinants of Health   Financial Resource Strain: Not on file  Food Insecurity: Not on file  Transportation Needs: Not on file  Physical Activity: Not on file  Stress: Not on file  Social Connections: Not on file  Intimate Partner Violence: Not on file    Family History  Problem Relation Age of Onset  . Multiple sclerosis Mother   . Cancer Father 74       stomach cancer died age 62  . Breast cancer Maternal Grandmother 53  . Drug abuse Brother   . Kidney disease Neg Hx   . Bladder Cancer Neg Hx      Immunization History  Administered Date(s) Administered  . Influenza Inj Mdck Quad Pf 11/02/2017  . Influenza,inj,Quad PF,6+ Mos 09/18/2014, 11/05/2015, 12/13/2016, 09/26/2018  . Influenza-Unspecified 11/21/2017, 10/10/2019  . Tdap 07/26/2012    Outpatient Encounter Medications as of 03/20/2020  Medication Sig  . ALPRAZolam (XANAX) 0.5 MG tablet Take 1 tablet (0.5 mg total) by mouth at bedtime as needed for anxiety.  Marland Kitchen aspirin EC 81 MG tablet Take 81 mg by mouth daily.  Marland Kitchen buPROPion (WELLBUTRIN XL) 150 MG 24 hr tablet  TAKE 1 TABLET BY MOUTH EVERY MORNING. GENERIC EQUIVALENT FOR WELLBUTRIN XL  . Calcium Carbonate-Vit D-Min (CALCIUM 1200 PO) Take 1 tablet by mouth daily.  . Coenzyme Q10 (COQ-10 PO) Take 1 tablet by mouth daily.   . Cranberry 500 MG CAPS Take one by mouth daily  . cyanocobalamin 500 MCG tablet Take 500 mcg by mouth daily.  . hydrochlorothiazide (HYDRODIURIL) 25 MG tablet Take 1 tablet (25 mg total) by mouth daily.  . pantoprazole (PROTONIX) 20 MG tablet Take 1 tablet (20 mg total) by mouth daily.  . simvastatin (ZOCOR) 10 MG tablet TAKE 1 TABLET BY MOUTH AT BEDTIME  . [DISCONTINUED] omeprazole (PRILOSEC) 20 MG capsule Take 1 capsule (20 mg total) by mouth daily.   No facility-administered encounter medications on file as of 03/20/2020.     ROS: Gen: no fever, chills  Skin: no rash, itching ENT: no ear pain, ear drainage, nasal congestion,  rhinorrhea, sinus pressure, sore throat Eyes: no blurry vision, double vision Resp: no cough, wheeze,SOB Breast: no breast tenderness, no nipple discharge, no breast masses CV: no CP, palpitations, LE edema,  GI: no heartburn, n/v/d/c, abd pain GU: no dysuria, urgency, frequency, hematuria MSK: no joint pain, myalgias, back pain Neuro: no dizziness, headache, weakness, vertigo Psych: no depression, anxiety, insomnia   Allergies  Allergen Reactions  . Sulfa Antibiotics Rash    BP (!) 130/98 (BP Location: Left Arm, Patient Position: Sitting, Cuff Size: Normal)   Pulse 74   Temp (!) 97.2 F (36.2 C) (Temporal)   Ht 5' 2.25" (1.581 m)   Wt 221 lb 9.6 oz (100.5 kg)   SpO2 96%   BMI 40.21 kg/m   Wt Readings from Last 3 Encounters:  03/20/20 221 lb 9.6 oz (100.5 kg)  02/12/19 216 lb (98 kg)  10/11/18 218 lb (98.9 kg)   Temp Readings from Last 3 Encounters:  03/20/20 (!) 97.2 F (36.2 C) (Temporal)  10/11/18 97.7 F (36.5 C)  08/07/18 98.6 F (37 C) (Oral)   BP Readings from Last 3 Encounters:  03/20/20 (!) 130/98  10/11/18 132/74  08/07/18 130/76   Pulse Readings from Last 3 Encounters:  03/20/20 74  10/11/18 75  08/07/18 78     Physical Exam Exam conducted with a chaperone present.  Constitutional:      General: She is not in acute distress.    Appearance: She is well-developed. She is obese.  HENT:     Head: Normocephalic and atraumatic.     Right Ear: Tympanic membrane and ear canal normal.     Left Ear: Tympanic membrane and ear canal normal.     Nose: Nose normal.     Mouth/Throat:     Mouth: Mucous membranes are moist.     Pharynx: Oropharynx is clear.  Eyes:     Conjunctiva/sclera: Conjunctivae normal.  Neck:     Thyroid: No thyromegaly.  Cardiovascular:     Rate and Rhythm: Normal rate and regular rhythm.     Heart sounds: Normal heart sounds. No murmur heard.   Pulmonary:     Effort: Pulmonary effort is normal. No respiratory distress.      Breath sounds: Normal breath sounds. No wheezing or rhonchi.  Abdominal:     General: Bowel sounds are normal. There is no distension.     Palpations: Abdomen is soft. There is no mass.     Tenderness: There is no abdominal tenderness.  Genitourinary:    General: Normal vulva.     Labia:  Right: No rash, tenderness or lesion.        Left: No rash, tenderness or lesion.      Vagina: No vaginal discharge, erythema, tenderness, bleeding or lesions.     Cervix: No cervical motion tenderness, discharge, friability, lesion, erythema or cervical bleeding.     Uterus: Normal. Not deviated and not enlarged.      Adnexa:        Right: No mass, tenderness or fullness.         Left: No mass, tenderness or fullness.    Musculoskeletal:     Cervical back: Neck supple.     Right lower leg: No edema.     Left lower leg: No edema.  Lymphadenopathy:     Cervical: No cervical adenopathy.  Skin:    General: Skin is warm and dry.  Neurological:     Mental Status: She is alert and oriented to person, place, and time.     Motor: No abnormal muscle tone.     Coordination: Coordination normal.  Psychiatric:        Behavior: Behavior normal.      A/P:  1. Annual physical exam - discussed importance of regular CV exercise, healthy diet, adequate sleep - PAP today, due for mammo and referral placed, colonoscopy due in 09/2020 - UTD on dental and vision - CBC - Basic metabolic panel - AST - ALT - next CPE in 1 year  2. Essential hypertension - DBP elevated today - on HCTZ 25mg  daily - check BP at home 3-4x/wk x 2 wks and f/u via Mychart - CBC - Basic metabolic panel  3. Vitamin D deficiency - VITAMIN D 25 Hydroxy (Vit-D Deficiency, Fractures)  4. B12 deficiency - pt takes b12 515mcg daily - Vitamin B12  5. Pure hypercholesterolemia - on zocor 10mg  daily - Lipid panel - AST - ALT  6. Gastroesophageal reflux disease with esophagitis without hemorrhage - pt has been on  omeprazole 20mg  daily x years and does not feel it is effective as it was in the past Rx: - pantoprazole (PROTONIX) 20 MG tablet; Take 1 tablet (20 mg total) by mouth daily.  Dispense: 90 tablet; Refill: 3  7. Encounter for screening mammogram for malignant neoplasm of breast - MM DIGITAL SCREENING BILATERAL; Future  8. Screening for cervical cancer - Cytology - PAP( [place])  9. Cognitive decline - mild - Ambulatory referral to Neurology   This visit occurred during the SARS-CoV-2 public health emergency.  Safety protocols were in place, including screening questions prior to the visit, additional usage of staff PPE, and extensive cleaning of exam room while observing appropriate contact time as indicated for disinfecting solutions.

## 2020-03-24 LAB — CYTOLOGY - PAP
Comment: NEGATIVE
Diagnosis: NEGATIVE
High risk HPV: NEGATIVE

## 2020-03-25 ENCOUNTER — Other Ambulatory Visit: Payer: Self-pay

## 2020-03-25 MED ORDER — SIMVASTATIN 10 MG PO TABS: 10.0000 mg | ORAL_TABLET | Freq: Every day | ORAL | 3 refills | Status: DC

## 2020-03-25 MED ORDER — BUPROPION HCL ER (XL) 150 MG PO TB24: ORAL_TABLET | ORAL | 3 refills | Status: DC

## 2020-03-25 NOTE — Telephone Encounter (Signed)
Last OV 03/21/2023 Last fill for Bupropion 02/20/19  #90/3 Last fill Simvastatin 02/20/19  #90/3

## 2020-03-26 ENCOUNTER — Other Ambulatory Visit: Payer: Self-pay

## 2020-03-26 ENCOUNTER — Encounter: Payer: Self-pay | Admitting: Family Medicine

## 2020-03-26 DIAGNOSIS — I10 Essential (primary) hypertension: Secondary | ICD-10-CM

## 2020-03-26 MED ORDER — HYDROCHLOROTHIAZIDE 25 MG PO TABS: 25.0000 mg | ORAL_TABLET | Freq: Every day | ORAL | 3 refills | Status: DC

## 2020-04-02 ENCOUNTER — Other Ambulatory Visit: Payer: Self-pay | Admitting: Family Medicine

## 2020-04-02 DIAGNOSIS — Z1231 Encounter for screening mammogram for malignant neoplasm of breast: Secondary | ICD-10-CM

## 2020-04-05 ENCOUNTER — Encounter: Payer: Self-pay | Admitting: Family Medicine

## 2020-04-05 DIAGNOSIS — I10 Essential (primary) hypertension: Secondary | ICD-10-CM

## 2020-04-06 NOTE — Telephone Encounter (Signed)
Please see message . Thank you .

## 2020-04-07 MED ORDER — AMLODIPINE BESYLATE 5 MG PO TABS: 5.0000 mg | ORAL_TABLET | Freq: Every day | ORAL | 3 refills | Status: DC

## 2020-04-09 ENCOUNTER — Other Ambulatory Visit: Payer: Self-pay

## 2020-04-09 ENCOUNTER — Ambulatory Visit
Admission: RE | Admit: 2020-04-09 | Discharge: 2020-04-09 | Disposition: A | Discharge status: Home / Self Care | Payer: BC Managed Care – PPO | Source: Ambulatory Visit | Attending: Family Medicine | Admitting: Family Medicine

## 2020-04-09 DIAGNOSIS — Z1231 Encounter for screening mammogram for malignant neoplasm of breast: Secondary | ICD-10-CM | POA: Diagnosis not present

## 2020-10-26 DIAGNOSIS — L821 Other seborrheic keratosis: Secondary | ICD-10-CM | POA: Diagnosis not present

## 2020-10-26 DIAGNOSIS — Z85828 Personal history of other malignant neoplasm of skin: Secondary | ICD-10-CM | POA: Diagnosis not present

## 2020-10-26 DIAGNOSIS — D2262 Melanocytic nevi of left upper limb, including shoulder: Secondary | ICD-10-CM | POA: Diagnosis not present

## 2020-10-26 DIAGNOSIS — D1801 Hemangioma of skin and subcutaneous tissue: Secondary | ICD-10-CM | POA: Diagnosis not present

## 2020-10-28 DIAGNOSIS — H5203 Hypermetropia, bilateral: Secondary | ICD-10-CM | POA: Diagnosis not present

## 2021-02-15 ENCOUNTER — Encounter: Payer: BC Managed Care – PPO | Admitting: Nurse Practitioner

## 2021-03-19 NOTE — Progress Notes (Signed)
BP 130/70 (BP Location: Left Arm, Patient Position: Sitting, Cuff Size: Normal)    Pulse 67    Temp (!) 97.4 F (36.3 C) (Temporal)    Ht '5\' 2"'$  (1.575 m)    Wt 224 lb 3.2 oz (101.7 kg)    SpO2 97%    BMI 41.01 kg/m    Subjective:    Patient ID: Hannah Ferguson, female    DOB: 27-Jul-1960, 61 y.o.   MRN: [id number]  CC:  Chief Complaint  Patient presents with   Annual Exam    Pt. Here for a CPE    HPI: Hannah Ferguson is a 61 y.o. female presenting on 03/22/2021 to transfer care to a new provider and for comprehensive medical examination. Current medical complaints include:none  HYPERTENSION / HYPERLIPIDEMIA  Satisfied with current treatment? yes Duration of hypertension: years BP monitoring frequency: not checking BP range: n/a BP medication side effects: no Past BP meds: amlodipine, hctz Duration of hyperlipidemia: years Cholesterol medication side effects: no Cholesterol supplements: none Past cholesterol medications: simvastatin Medication compliance: excellent compliance Aspirin: yes Recent stressors: no Recurrent headaches: no Visual changes: no Palpitations: no Dyspnea: no Chest pain: no Lower extremity edema: no Dizzy/lightheaded:  at times  Her depression symptoms are well controlled on bupropion '150mg'$  daily. She also takes xanax 0.'25mg'$  at bedtime prn. She takes this about once a month and a 30 day supply will last more than a year.   She currently lives with: husband Menopausal Symptoms: no  Depression and Anxiety Screen done today and results listed below:  Depression screen [facility] 2/9 03/22/2021 02/12/2019 12/21/2017 12/13/2016 09/18/2014  Decreased Interest 1 0 0 0 0  Down, Depressed, Hopeless 0 0 0 0 0  PHQ - 2 Score 1 0 0 0 0  Altered sleeping - - - 0 -  Tired, decreased energy - - - 0 -  Change in appetite - - - 0 -  Feeling bad or failure about yourself  - - - 0 -  Trouble concentrating - - - 0 -  Moving slowly or fidgety/restless - - - 0 -  Suicidal  thoughts - - - 0 -  PHQ-9 Score - - - 0 -   GAD 7 : Generalized Anxiety Score 03/22/2021  Nervous, Anxious, on Edge 0  Control/stop worrying 0  Worry too much - different things 0  Trouble relaxing 0  Restless 0  Easily annoyed or irritable 0  Afraid - awful might happen 0  Total GAD 7 Score 0     The patient does not have a history of falls. I did not complete a risk assessment for falls. A plan of care for falls was not documented.   Past Medical History:  Past Medical History:  Diagnosis Date   Basal cell carcinoma 2022   Depression    GERD (gastroesophageal reflux disease)    Heartburn    Hyperlipidemia    Hypertension    Pt denies   Wears contact lenses     Surgical History:  Past Surgical History:  Procedure Laterality Date   COLONOSCOPY WITH PROPOFOL N/A 10/02/2015   Procedure: COLONOSCOPY WITH PROPOFOL;  Surgeon: Lucilla Lame, MD;  Location: [facility];  Service: Endoscopy;  Laterality: N/A;   KNEE SURGERY     POLYPECTOMY  10/02/2015   Procedure: POLYPECTOMY;  Surgeon: Lucilla Lame, MD;  Location: [facility];  Service: Endoscopy;;   TONSILLECTOMY  2006    Medications:  Current Outpatient Medications on File  Prior to Visit  Medication Sig   aspirin EC 81 MG tablet Take 81 mg by mouth daily.   Calcium Carbonate-Vit D-Min (CALCIUM 1200 PO) Take 1 tablet by mouth daily.   Cranberry 500 MG CAPS Take one by mouth daily   Coenzyme Q10 (COQ-10 PO) Take 1 tablet by mouth daily.  (Patient not taking: Reported on 03/22/2021)   cyanocobalamin 500 MCG tablet Take 500 mcg by mouth daily. (Patient not taking: Reported on 03/22/2021)   No current facility-administered medications on file prior to visit.    Allergies:  Allergies  Allergen Reactions   Sulfa Antibiotics Rash    Social History:  Social History   Socioeconomic History   Marital status: Married    Spouse name: Not on file   Number of children: Not on file   Years of education: Not on  file   Highest education level: Not on file  Occupational History   Not on file  Tobacco Use   Smoking status: Former    Packs/day: 0.25    Years: 4.00    Pack years: 1.00    Types: Cigarettes    Quit date: 01/17/1985    Years since quitting: 36.2   Smokeless tobacco: Never  Vaping Use   Vaping Use: Never used  Substance and Sexual Activity   Alcohol use: Yes    Alcohol/week: 2.0 standard drinks    Types: 2 Glasses of wine per week    Comment:     Drug use: No   Sexual activity: Not Currently  Other Topics Concern   Not on file  Social History Narrative   Not on file   Social Determinants of Health   Financial Resource Strain: Not on file  Food Insecurity: Not on file  Transportation Needs: Not on file  Physical Activity: Not on file  Stress: Not on file  Social Connections: Not on file  Intimate Partner Violence: Not on file   Social History   Tobacco Use  Smoking Status Former   Packs/day: 0.25   Years: 4.00   Pack years: 1.00   Types: Cigarettes   Quit date: 01/17/1985   Years since quitting: 36.2  Smokeless Tobacco Never   Social History   Substance and Sexual Activity  Alcohol Use Yes   Alcohol/week: 2.0 standard drinks   Types: 2 Glasses of wine per week   Comment:      Family History:  Family History  Problem Relation Age of Onset   Multiple sclerosis Mother    Cancer Father 34       stomach cancer died age 6   Breast cancer Maternal Grandmother 4   Drug abuse Brother    Kidney disease Neg Hx    Bladder Cancer Neg Hx     Past medical history, surgical history, medications, allergies, family history and social history reviewed with patient today and changes made to appropriate areas of the chart.   Review of Systems  Constitutional: Negative.   HENT: Negative.    Eyes: Negative.   Respiratory: Negative.    Cardiovascular: Negative.   Gastrointestinal: Negative.   Genitourinary: Negative.   Musculoskeletal: Negative.   Skin:  Negative.   Neurological: Negative.   Psychiatric/Behavioral: Negative.    All other ROS negative except what is listed above and in the HPI.      Objective:    BP 130/70 (BP Location: Left Arm, Patient Position: Sitting, Cuff Size: Normal)    Pulse 67    Temp (!) 97.4  F (36.3 C) (Temporal)    Ht '5\' 2"'$  (1.575 m)    Wt 224 lb 3.2 oz (101.7 kg)    SpO2 97%    BMI 41.01 kg/m   Wt Readings from Last 3 Encounters:  03/22/21 224 lb 3.2 oz (101.7 kg)  03/20/20 221 lb 9.6 oz (100.5 kg)  02/12/19 216 lb (98 kg)    Physical Exam Vitals and nursing note reviewed.  Constitutional:      General: She is not in acute distress.    Appearance: Normal appearance.  HENT:     Head: Normocephalic and atraumatic.     Right Ear: Tympanic membrane, ear canal and external ear normal.     Left Ear: Tympanic membrane, ear canal and external ear normal.     Nose: Nose normal.     Mouth/Throat:     Mouth: Mucous membranes are moist.     Pharynx: Oropharynx is clear.  Eyes:     Conjunctiva/sclera: Conjunctivae normal.  Cardiovascular:     Rate and Rhythm: Normal rate and regular rhythm.     Pulses: Normal pulses.     Heart sounds: Normal heart sounds.  Pulmonary:     Effort: Pulmonary effort is normal.     Breath sounds: Normal breath sounds.  Abdominal:     General: Bowel sounds are normal.     Palpations: Abdomen is soft.     Tenderness: There is no abdominal tenderness.  Genitourinary:    Comments: deferred Musculoskeletal:        General: Normal range of motion.     Cervical back: Normal range of motion and neck supple. No tenderness.  Lymphadenopathy:     Cervical: No cervical adenopathy.  Skin:    General: Skin is warm and dry.  Neurological:     General: No focal deficit present.     Mental Status: She is alert and oriented to person, place, and time.     Cranial Nerves: No cranial nerve deficit.     Motor: No weakness.     Coordination: Coordination normal.     Gait: Gait normal.   Psychiatric:        Mood and Affect: Mood normal.        Behavior: Behavior normal.        Thought Content: Thought content normal.        Judgment: Judgment normal.    Results for orders placed or performed in visit on 03/20/20  Vitamin B12  Result Value Ref Range   Vitamin B-12 >1506 (H) 211 - 911 pg/mL  VITAMIN D 25 Hydroxy (Vit-D Deficiency, Fractures)  Result Value Ref Range   VITD 42.12 30.00 - 100.00 ng/mL  Lipid panel  Result Value Ref Range   Cholesterol 198 0 - 200 mg/dL   Triglycerides 131.0 0.0 - 149.0 mg/dL   HDL 72.00 >39.00 mg/dL   VLDL 26.2 0.0 - 40.0 mg/dL   LDL Cholesterol 100 (H) 0 - 99 mg/dL   Total CHOL/HDL Ratio 3    NonHDL 125.74   CBC  Result Value Ref Range   WBC 6.8 4.0 - 10.5 K/uL   RBC 5.18 (H) 3.87 - 5.11 Mil/uL   Platelets 271.0 150.0 - 400.0 K/uL   Hemoglobin 15.9 (H) 12.0 - 15.0 g/dL   HCT 45.9 36.0 - 46.0 %   MCV 88.6 78.0 - 100.0 fl   MCHC 34.8 30.0 - 36.0 g/dL   RDW 12.7 11.5 - 86.5 %  Basic metabolic panel  Result  Value Ref Range   Sodium 141 135 - 145 mEq/L   Potassium 3.6 3.5 - 5.1 mEq/L   Chloride 102 96 - 112 mEq/L   CO2 31 19 - 32 mEq/L   Glucose, Bld 101 (H) 70 - 99 mg/dL   BUN 16 6 - 23 mg/dL   Creatinine, Ser 1.05 0.40 - 1.20 mg/dL   GFR 57.45 (L) >60.00 mL/min   Calcium 9.6 8.4 - 10.5 mg/dL  AST  Result Value Ref Range   AST 17 0 - 37 U/L  ALT  Result Value Ref Range   ALT 22 0 - 35 U/L  Cytology - PAP( [place])  Result Value Ref Range   High risk HPV Negative    Adequacy      Satisfactory for evaluation. The presence or absence of an   Adequacy      endocervical/transformation zone component cannot be determined because   Adequacy of atrophy.    Diagnosis      - Negative for intraepithelial lesion or malignancy (NILM)   Comment Inflammation and atrophic changes are present.    Comment Normal Reference Range HPV - Negative       Assessment & Plan:   Problem List Items Addressed This Visit        Cardiovascular and Mediastinum   Hypertension    Chronic, stable. BP well controlled at today's visit. She is not checking her blood pressure at home. Will continue amlodipine '5mg'$  daily and HCTZ '25mg'$  daily. Refill sent to the pharmacy. Check CMP, CBC today.       Relevant Medications   amLODipine (NORVASC) 5 MG tablet   hydrochlorothiazide (HYDRODIURIL) 25 MG tablet   simvastatin (ZOCOR) 10 MG tablet   Other Relevant Orders   CBC with Differential/Platelet   Comprehensive metabolic panel     Digestive   GERD (gastroesophageal reflux disease)    Chronic, stable. Well controlled on pantoprazole '40mg'$  daily. Continue current regimen, refill sent to the pharmacy.       Relevant Medications   pantoprazole (PROTONIX) 20 MG tablet     Other   Hyperlipidemia    Chronic. Continue simvastatin '10mg'$  daily. Refill sent to the pharmacy. Check lipid panel and adjust regimen based on results.       Relevant Medications   amLODipine (NORVASC) 5 MG tablet   hydrochlorothiazide (HYDRODIURIL) 25 MG tablet   simvastatin (ZOCOR) 10 MG tablet   Other Relevant Orders   Lipid panel   Depression    Chronic, stable. Continue bupropion '150mg'$  daily and xanax 0.'25mg'$  qHS prn. She states this about once a month as needed. PDMP reviewed. She would like to continue this medication. Refill sent to the pharmacy.       Relevant Medications   ALPRAZolam (XANAX) 0.5 MG tablet   buPROPion (WELLBUTRIN XL) 150 MG 24 hr tablet   B12 deficiency    History of B12 deficiency, her last level was elevated and she was told to stop her supplement. Will check vitamin B12 levels and treat based on results.       Relevant Orders   Vitamin B12   Morbid obesity ([place])    BMI 41. She currently does not exercise. Discussed diet/exercise and information given. Goal is to lose 1-2 pounds per week.       Other Visit Diagnoses     Routine general medical examination at a health care facility    -  Primary   Health maintenance  reviewed and updated. UTD on  vaccinations. Discussed diet/exercise along with information printed. F/U 1 year.    Sleep disturbance       Continue xanax 0.'25mg'$  prn. She takes this about once a month. PDMP reviewed. Benefits/risks reviewed. Refill sent to pharmacy for 30 day supply.    Relevant Medications   ALPRAZolam (XANAX) 0.5 MG tablet   Encounter for screening mammogram for malignant neoplasm of breast       Mammogram ordered today   Relevant Orders   MM 3D [place] for colon cancer       Referral placed for colonoscopy   Relevant Orders   Ambulatory referral to Gastroenterology        Follow up plan: Return in about 1 year (around 03/23/2022) for CPE.   LABORATORY TESTING:  - Pap smear: up to date  IMMUNIZATIONS:   - Tdap: Tetanus vaccination status reviewed: last tetanus booster within 10 years. - Influenza: Up to date - Pneumovax: Not applicable - Prevnar: Not applicable - HPV: Not applicable - Zostavax vaccine: Up to date  SCREENING: -Mammogram: Up to date  - Colonoscopy: Ordered today  - Bone Density: Not applicable  -Hearing Test: Not applicable  -Spirometry: Not applicable   PATIENT COUNSELING:   Advised to take 1 mg of folate supplement per day if capable of pregnancy.   Sexuality: Discussed sexually transmitted diseases, partner selection, use of condoms, avoidance of unintended pregnancy  and contraceptive alternatives.   Advised to avoid cigarette smoking.  I discussed with the patient that most people either abstain from alcohol or drink within safe limits (<=14/week and <=4 drinks/occasion for males, <=7/weeks and <= 3 drinks/occasion for females) and that the risk for alcohol disorders and other health effects rises proportionally with the number of drinks per week and how often a drinker exceeds daily limits.  Discussed cessation/primary prevention of drug use and availability of treatment for abuse.   Diet: Encouraged to  adjust caloric intake to maintain  or achieve ideal body weight, to reduce intake of dietary saturated fat and total fat, to limit sodium intake by avoiding high sodium foods and not adding table salt, and to maintain adequate dietary potassium and calcium preferably from fresh fruits, vegetables, and low-fat dairy products.    stressed the importance of regular exercise  Injury prevention: Discussed safety belts, safety helmets, smoke detector, smoking near bedding or upholstery.   Dental health: Discussed importance of regular tooth brushing, flossing, and dental visits.    NEXT PREVENTATIVE PHYSICAL DUE IN 1 YEAR. Return in about 1 year (around 03/23/2022) for CPE.

## 2021-03-22 ENCOUNTER — Other Ambulatory Visit: Payer: Self-pay

## 2021-03-22 ENCOUNTER — Other Ambulatory Visit (INDEPENDENT_AMBULATORY_CARE_PROVIDER_SITE_OTHER): Payer: BC Managed Care – PPO

## 2021-03-22 ENCOUNTER — Encounter: Payer: Self-pay | Admitting: Nurse Practitioner

## 2021-03-22 ENCOUNTER — Ambulatory Visit (INDEPENDENT_AMBULATORY_CARE_PROVIDER_SITE_OTHER): Payer: BC Managed Care – PPO | Admitting: Nurse Practitioner

## 2021-03-22 VITALS — BP 130/70 | HR 67 | Temp 97.4°F | Ht 62.0 in | Wt 224.2 lb

## 2021-03-22 DIAGNOSIS — K2100 Gastro-esophageal reflux disease with esophagitis, without bleeding: Secondary | ICD-10-CM | POA: Diagnosis not present

## 2021-03-22 DIAGNOSIS — I10 Essential (primary) hypertension: Secondary | ICD-10-CM

## 2021-03-22 DIAGNOSIS — E7800 Pure hypercholesterolemia, unspecified: Secondary | ICD-10-CM

## 2021-03-22 DIAGNOSIS — E538 Deficiency of other specified B group vitamins: Secondary | ICD-10-CM

## 2021-03-22 DIAGNOSIS — Z86010 Personal history of colonic polyps: Secondary | ICD-10-CM

## 2021-03-22 DIAGNOSIS — Z1211 Encounter for screening for malignant neoplasm of colon: Secondary | ICD-10-CM

## 2021-03-22 DIAGNOSIS — R7301 Impaired fasting glucose: Secondary | ICD-10-CM | POA: Diagnosis not present

## 2021-03-22 DIAGNOSIS — Z0000 Encounter for general adult medical examination without abnormal findings: Secondary | ICD-10-CM | POA: Diagnosis not present

## 2021-03-22 DIAGNOSIS — Z1231 Encounter for screening mammogram for malignant neoplasm of breast: Secondary | ICD-10-CM

## 2021-03-22 DIAGNOSIS — G479 Sleep disorder, unspecified: Secondary | ICD-10-CM

## 2021-03-22 DIAGNOSIS — F320 Major depressive disorder, single episode, mild: Secondary | ICD-10-CM

## 2021-03-22 LAB — CBC WITH DIFFERENTIAL/PLATELET
Basophils Absolute: 0 10*3/uL (ref 0.0–0.1)
Basophils Relative: 0.7 % (ref 0.0–3.0)
Eosinophils Absolute: 0.2 10*3/uL (ref 0.0–0.7)
Eosinophils Relative: 2.6 % (ref 0.0–5.0)
HCT: 45.8 % (ref 36.0–46.0)
Hemoglobin: 15.7 g/dL — ABNORMAL HIGH (ref 12.0–15.0)
Lymphocytes Relative: 42.5 % (ref 12.0–46.0)
Lymphs Abs: 3 10*3/uL (ref 0.7–4.0)
MCHC: 34.2 g/dL (ref 30.0–36.0)
MCV: 90.2 fl (ref 78.0–100.0)
Monocytes Absolute: 0.6 10*3/uL (ref 0.1–1.0)
Monocytes Relative: 8.3 % (ref 3.0–12.0)
Neutro Abs: 3.3 10*3/uL (ref 1.4–7.7)
Neutrophils Relative %: 45.9 % (ref 43.0–77.0)
Platelets: 271 10*3/uL (ref 150.0–400.0)
RBC: 5.08 Mil/uL (ref 3.87–5.11)
RDW: 13 % (ref 11.5–15.5)
WBC: 7.1 10*3/uL (ref 4.0–10.5)

## 2021-03-22 LAB — COMPREHENSIVE METABOLIC PANEL
ALT: 22 U/L (ref 0–35)
AST: 19 U/L (ref 0–37)
Albumin: 4.4 g/dL (ref 3.5–5.2)
Alkaline Phosphatase: 39 U/L (ref 39–117)
BUN: 16 mg/dL (ref 6–23)
CO2: 30 mEq/L (ref 19–32)
Calcium: 9.8 mg/dL (ref 8.4–10.5)
Chloride: 101 mEq/L (ref 96–112)
Creatinine, Ser: 1.07 mg/dL (ref 0.40–1.20)
GFR: 55.77 mL/min — ABNORMAL LOW (ref >60.00)
Glucose, Bld: 114 mg/dL — ABNORMAL HIGH (ref 70–99)
Potassium: 3.5 mEq/L (ref 3.5–5.1)
Sodium: 141 mEq/L (ref 135–145)
Total Bilirubin: 0.6 mg/dL (ref 0.2–1.2)
Total Protein: 7.2 g/dL (ref 6.0–8.3)

## 2021-03-22 LAB — LIPID PANEL
Cholesterol: 193 mg/dL (ref 0–200)
HDL: 78 mg/dL (ref >39.00)
LDL Cholesterol: 94 mg/dL (ref 0–99)
NonHDL: 114.73
Total CHOL/HDL Ratio: 2
Triglycerides: 106 mg/dL (ref 0.0–149.0)
VLDL: 21.2 mg/dL (ref 0.0–40.0)

## 2021-03-22 LAB — VITAMIN B12: Vitamin B-12: 390 pg/mL (ref 211–911)

## 2021-03-22 LAB — HEMOGLOBIN A1C: Hgb A1c MFr Bld: 5.7 % (ref 4.6–6.5)

## 2021-03-22 MED ORDER — ALPRAZOLAM 0.5 MG PO TABS: 0.5000 mg | ORAL_TABLET | Freq: Every evening | ORAL | 0 refills | Status: DC | PRN

## 2021-03-22 MED ORDER — NA SULFATE-K SULFATE-MG SULF 17.5-3.13-1.6 GM/177ML PO SOLN: 1.0000 | Freq: Once | ORAL | 0 refills | Status: AC

## 2021-03-22 MED ORDER — BUPROPION HCL ER (XL) 150 MG PO TB24: ORAL_TABLET | ORAL | 3 refills | Status: DC

## 2021-03-22 MED ORDER — PANTOPRAZOLE SODIUM 20 MG PO TBEC: 20.0000 mg | DELAYED_RELEASE_TABLET | Freq: Every day | ORAL | 3 refills | Status: DC

## 2021-03-22 MED ORDER — AMLODIPINE BESYLATE 5 MG PO TABS
5.0000 mg | ORAL_TABLET | Freq: Every day | ORAL | 3 refills | Status: DC
Start: 2021-03-22 — End: 2022-02-14

## 2021-03-22 MED ORDER — SIMVASTATIN 10 MG PO TABS: 10.0000 mg | ORAL_TABLET | Freq: Every day | ORAL | 3 refills | Status: DC

## 2021-03-22 MED ORDER — HYDROCHLOROTHIAZIDE 25 MG PO TABS: 25.0000 mg | ORAL_TABLET | Freq: Every day | ORAL | 3 refills | Status: DC

## 2021-03-22 NOTE — Progress Notes (Signed)
Gastroenterology Pre-Procedure Review ? ?Request Date: 04/06/2021 ?Requesting Physician: Dr. Marius Ditch ? ?PATIENT REVIEW QUESTIONS: The patient responded to the following health history questions as indicated:   ? ?1. Are you having any GI issues? no ?2. Do you have a personal history of Polyps? yes (last colonoscopy) ?3. Do you have a family history of Colon Cancer or Polyps? no ?4. Diabetes Mellitus? no ?5. Joint replacements in the past 12 months?no ?6. Major health problems in the past 3 months?no ?7. Any artificial heart valves, MVP, or defibrillator?no ?   ?MEDICATIONS & ALLERGIES:    ?Patient reports the following regarding taking any anticoagulation/antiplatelet therapy:   ?Plavix, Coumadin, Eliquis, Xarelto, Lovenox, Pradaxa, Brilinta, or Effient? no ?Aspirin? yes (81 mg) ? ?Patient confirms/reports the following medications:  ?Current Outpatient Medications  ?Medication Sig Dispense Refill  ? ALPRAZolam (XANAX) 0.5 MG tablet Take 1 tablet (0.5 mg total) by mouth at bedtime as needed for anxiety. 30 tablet 0  ? amLODipine (NORVASC) 5 MG tablet Take 1 tablet (5 mg total) by mouth daily. 90 tablet 3  ? aspirin EC 81 MG tablet Take 81 mg by mouth daily.    ? buPROPion (WELLBUTRIN XL) 150 MG 24 hr tablet TAKE 1 TABLET BY MOUTH EVERY MORNING. GENERIC EQUIVALENT FOR WELLBUTRIN XL 90 tablet 3  ? Calcium Carbonate-Vit D-Min (CALCIUM 1200 PO) Take 1 tablet by mouth daily.    ? Coenzyme Q10 (COQ-10 PO) Take 1 tablet by mouth daily.  (Patient not taking: Reported on 03/22/2021)    ? Cranberry 500 MG CAPS Take one by mouth daily    ? cyanocobalamin 500 MCG tablet Take 500 mcg by mouth daily. (Patient not taking: Reported on 03/22/2021)    ? hydrochlorothiazide (HYDRODIURIL) 25 MG tablet Take 1 tablet (25 mg total) by mouth daily. 90 tablet 3  ? pantoprazole (PROTONIX) 20 MG tablet Take 1 tablet (20 mg total) by mouth daily. 90 tablet 3  ? simvastatin (ZOCOR) 10 MG tablet Take 1 tablet (10 mg total) by mouth at bedtime. 90  tablet 3  ? ?No current facility-administered medications for this visit.  ? ? ?Patient confirms/reports the following allergies:  ?Allergies  ?Allergen Reactions  ? Sulfa Antibiotics Rash  ? ? ?No orders of the defined types were placed in this encounter. ? ? ?AUTHORIZATION INFORMATION ?Primary Insurance: ?1D#: ?Group #: ? ?Secondary Insurance: ?1D#: ?Group #: ? ?SCHEDULE INFORMATION: ?Date: 07/07/2021 ?Time: ?Location:[facility] ? ?

## 2021-03-22 NOTE — Assessment & Plan Note (Signed)
BMI 41. She currently does not exercise. Discussed diet/exercise and information given. Goal is to lose 1-2 pounds per week.  ?

## 2021-03-22 NOTE — Assessment & Plan Note (Signed)
Chronic, stable. BP well controlled at today's visit. She is not checking her blood pressure at home. Will continue amlodipine '5mg'$  daily and HCTZ '25mg'$  daily. Refill sent to the pharmacy. Check CMP, CBC today.  ?

## 2021-03-22 NOTE — Addendum Note (Signed)
Addended by: Vance Peper A on: 03/22/2021 03:09 PM ? ? Modules accepted: Orders ? ?

## 2021-03-22 NOTE — Assessment & Plan Note (Signed)
Chronic, stable. Well controlled on pantoprazole '40mg'$  daily. Continue current regimen, refill sent to the pharmacy.  ?

## 2021-03-22 NOTE — Patient Instructions (Addendum)
It was great to see you! ? ?We are checking your labs today. I sent refills on all your medications. ? ?[facility]  ?Address: [address] [place], [place], [place] [address]  ?Phone: [phone] ? ?I have placed the referral to Dr. Allen Norris for your colonoscopy.  ? ?Let's follow-up in 1 year, sooner if you have concerns. ? ?If a referral was placed today, you will be contacted for an appointment. Please note that routine referrals can sometimes take up to 3-4 weeks to process. Please call our office if you haven't heard anything after this time frame. ? ?Take care, ? ?Vance Peper, NP ? ?

## 2021-03-22 NOTE — Assessment & Plan Note (Signed)
Chronic, stable. Continue bupropion '150mg'$  daily and xanax 0.'25mg'$  qHS prn. She states this about once a month as needed. PDMP reviewed. She would like to continue this medication. Refill sent to the pharmacy.  ?

## 2021-03-22 NOTE — Assessment & Plan Note (Signed)
History of B12 deficiency, her last level was elevated and she was told to stop her supplement. Will check vitamin B12 levels and treat based on results.  ?

## 2021-03-22 NOTE — Assessment & Plan Note (Signed)
Chronic. Continue simvastatin '10mg'$  daily. Refill sent to the pharmacy. Check lipid panel and adjust regimen based on results.  ?

## 2021-03-25 ENCOUNTER — Encounter: Payer: Self-pay | Admitting: Gastroenterology

## 2021-03-31 DIAGNOSIS — U071 COVID-19: Secondary | ICD-10-CM

## 2021-03-31 HISTORY — DX: COVID-19: U07.1

## 2021-04-06 ENCOUNTER — Telehealth (INDEPENDENT_AMBULATORY_CARE_PROVIDER_SITE_OTHER): Payer: BC Managed Care – PPO | Admitting: Nurse Practitioner

## 2021-04-06 ENCOUNTER — Encounter: Payer: Self-pay | Admitting: Nurse Practitioner

## 2021-04-06 DIAGNOSIS — J069 Acute upper respiratory infection, unspecified: Secondary | ICD-10-CM | POA: Diagnosis not present

## 2021-04-06 MED ORDER — AZITHROMYCIN 250 MG PO TABS: ORAL_TABLET | ORAL | 0 refills | Status: AC

## 2021-04-06 MED ORDER — PREDNISONE 10 MG PO TABS
ORAL_TABLET | ORAL | 0 refills | Status: DC
Start: 2021-04-06 — End: 2022-03-25

## 2021-04-06 NOTE — Assessment & Plan Note (Signed)
Acute, symptoms ongoing for 7 days.  Home COVID test negative x2, however patient has experienced loss of smell and taste.  She declines to come in for PCR test today.  She is outside the treatment window for Paxlovid if she tested positive.  With ongoing fevers, cough, wheezing we will treat with prednisone taper and azithromycin.  Encouraged her to get plenty of rest, drink plenty of fluids.  She can continue ibuprofen as needed for fever/pain.  Encouraged her to wear a mask if she needs to go out in public.  She states that she can work from home.  Follow-up if symptoms do not improve or worsen. ?

## 2021-04-06 NOTE — Progress Notes (Signed)
?[facility] ?[facility] ?[place] ?[place] [address] ?Dept: [phone] ?Dept Fax: [phone] ? ?Virtual Video Visit ? ?I connected with Hannah Ferguson on 04/06/21 at 10:00 AM EDT by a video enabled telemedicine application and verified that I am speaking with the correct person using two identifiers. ? ?Location patient: Home ?Location provider: Clinic ?Persons participating in the virtual visit: Patient, Provider, [place] ? ?I discussed the limitations of evaluation and management by telemedicine and the availability of in person appointments. The patient expressed understanding and agreed to proceed. ? ?Chief Complaint  ?Patient presents with  ? URI  ?  Pt c/o low-grade fever, loss of smell and taste, hean/chest congestion, body aches x6 days  ? ? ?SUBJECTIVE: ? ?HPI: Hannah Ferguson is a 61 y.o. female who presents with low grade fever, head and chest congestion and loss of smell and taste for 6 days. 2 home covid-19 tests were negative, but they were expired.  ? ?UPPER RESPIRATORY TRACT INFECTION ? ?Fever: yes ?Cough: yes ?Shortness of breath: no ?Wheezing: yes ?Chest pain: no ?Chest tightness: no ?Chest congestion: yes ?Nasal congestion: yes ?Runny nose: yes ?Post nasal drip: yes ?Sneezing: yes on Sunday ?Sore throat: no ?Swollen glands: no ?Sinus pressure: no ?Headache: no ?Face pain: no ?Toothache: no ?Ear pain: no bilateral ?Ear pressure: no bilateral ?Eyes red/itching:no ?Eye drainage/crusting: no  ?Vomiting: no ?Rash: no ?Fatigue: yes ?Sick contacts: no ?Strep contacts: no  ?Context: stable ?Recurrent sinusitis: no ?Relief with OTC cold/cough medications: yes  ?Treatments attempted: ibuprofen ? ?Patient Active Problem List  ? Diagnosis Date Noted  ? Upper respiratory tract infection 04/06/2021  ? Morbid obesity ([place]) 03/22/2021  ? Erythrocytosis 01/17/2018  ? Well woman exam without gynecological exam 12/21/2017  ? History of colonic polyps   ? Benign neoplasm  of descending colon   ? Benign neoplasm of sigmoid colon   ? B12 deficiency 05/08/2014  ? History of recurrent UTIs 08/15/2013  ? GERD (gastroesophageal reflux disease) 04/22/2013  ? Depression 09/07/2011  ? Hypertension   ? Hyperlipidemia   ? ? ?Past Surgical History:  ?Procedure Laterality Date  ? COLONOSCOPY WITH PROPOFOL N/A 10/02/2015  ? Procedure: COLONOSCOPY WITH PROPOFOL;  Surgeon: Lucilla Lame, MD;  Location: [facility];  Service: Endoscopy;  Laterality: N/A;  ? KNEE SURGERY    ? POLYPECTOMY  10/02/2015  ? Procedure: POLYPECTOMY;  Surgeon: Lucilla Lame, MD;  Location: [facility];  Service: Endoscopy;;  ? TONSILLECTOMY  2006  ? ? ?Family History  ?Problem Relation Age of Onset  ? Multiple sclerosis Mother   ? Cancer Father 84  ?     stomach cancer died age 109  ? Breast cancer Maternal Grandmother 50  ? Drug abuse Brother   ? Kidney disease Neg Hx   ? Bladder Cancer Neg Hx   ? ? ?Social History  ? ?Tobacco Use  ? Smoking status: Former  ?  Packs/day: 0.25  ?  Years: 4.00  ?  Pack years: 1.00  ?  Types: Cigarettes  ?  Quit date: 01/17/1985  ?  Years since quitting: 36.2  ? Smokeless tobacco: Never  ?Vaping Use  ? Vaping Use: Never used  ?Substance Use Topics  ? Alcohol use: Yes  ?  Alcohol/week: 2.0 standard drinks  ?  Types: 2 Glasses of wine per week  ?  Comment: occasionally  ? Drug use: No  ? ? ? ?Current Outpatient Medications:  ?  azithromycin (ZITHROMAX) 250  MG tablet, Take 2 tablets on day 1, then 1 tablet daily on days 2 through 5, Disp: 6 tablet, Rfl: 0 ?  predniSONE (DELTASONE) 10 MG tablet, Take 6 tablets today, then 5 tablets tomorrow, then decrease by 1 tablet every day until gone, Disp: 21 tablet, Rfl: 0 ?  ALPRAZolam (XANAX) 0.5 MG tablet, Take 1 tablet (0.5 mg total) by mouth at bedtime as needed for anxiety., Disp: 30 tablet, Rfl: 0 ?  amLODipine (NORVASC) 5 MG tablet, Take 1 tablet (5 mg total) by mouth daily., Disp: 90 tablet, Rfl: 3 ?  aspirin EC 81 MG tablet, Take 81 mg by  mouth daily., Disp: , Rfl:  ?  buPROPion (WELLBUTRIN XL) 150 MG 24 hr tablet, TAKE 1 TABLET BY MOUTH EVERY MORNING. GENERIC EQUIVALENT FOR WELLBUTRIN XL, Disp: 90 tablet, Rfl: 3 ?  calcium carbonate (OS-CAL - DOSED IN MG OF ELEMENTAL CALCIUM) 1250 (500 Ca) MG tablet, Take 1 tablet by mouth., Disp: , Rfl:  ?  Calcium Carbonate Antacid 650 MG tablet, Take 1 tablet by mouth daily. (Patient not taking: Reported on 03/25/2021), Disp: , Rfl:  ?  Calcium Carbonate-Vit D-Min (CALCIUM 1200 PO), Take 1 tablet by mouth daily. (Patient not taking: Reported on 03/25/2021), Disp: , Rfl:  ?  cholecalciferol (VITAMIN D3) 25 MCG (1000 UNIT) tablet, Take 1,000 Units by mouth daily., Disp: , Rfl:  ?  Cranberry 500 MG CAPS, Take one by mouth daily, Disp: , Rfl:  ?  hydrochlorothiazide (HYDRODIURIL) 25 MG tablet, Take 1 tablet (25 mg total) by mouth daily., Disp: 90 tablet, Rfl: 3 ?  Omega-3 Fatty Acids (FISH OIL) 1000 MG CAPS, Take by mouth daily., Disp: , Rfl:  ?  omeprazole (PRILOSEC) 40 MG capsule, , Disp: , Rfl:  ?  pantoprazole (PROTONIX) 20 MG tablet, Take 1 tablet (20 mg total) by mouth daily., Disp: 90 tablet, Rfl: 3 ?  simvastatin (ZOCOR) 10 MG tablet, Take 1 tablet (10 mg total) by mouth at bedtime., Disp: 90 tablet, Rfl: 3 ?  tretinoin (RETIN-A) 0.025 % cream, SMARTSIG:1 sparingly Topical Every Night, Disp: , Rfl:  ? ?Allergies  ?Allergen Reactions  ? Sulfa Antibiotics Rash  ? ? ?ROS: See pertinent positives and negatives per HPI. ? ?OBSERVATIONS/OBJECTIVE: ? ?VITALS per patient if applicable: ?There were no vitals filed for this visit. ?There is no height or weight on file to calculate BMI. ?  ? ?GENERAL: Alert and oriented. Appears well and in no acute distress. ? ?HEENT: Atraumatic. Conjunctiva clear. No obvious abnormalities on inspection of external nose and ears. ? ?NECK: Normal movements of the head and neck. ? ?LUNGS: On inspection, no signs of respiratory distress. Breathing rate appears normal. No obvious gross SOB,  gasping or wheezing, and no conversational dyspnea. Coughing frequently during visit.  ? ?CV: No obvious cyanosis. ? ?MS: Moves all visible extremities without noticeable abnormality. ? ?PSYCH/NEURO: Pleasant and cooperative. No obvious depression or anxiety. Speech and thought processing grossly intact. ? ?ASSESSMENT AND PLAN: ? ?Problem List Items Addressed This Visit   ? ?  ? Respiratory  ? Upper respiratory tract infection - Primary  ?  Acute, symptoms ongoing for 7 days.  Home COVID test negative x2, however patient has experienced loss of smell and taste.  She declines to come in for PCR test today.  She is outside the treatment window for Paxlovid if she tested positive.  With ongoing fevers, cough, wheezing we will treat with prednisone taper and azithromycin.  Encouraged her to get  plenty of rest, drink plenty of fluids.  She can continue ibuprofen as needed for fever/pain.  Encouraged her to wear a mask if she needs to go out in public.  She states that she can work from home.  Follow-up if symptoms do not improve or worsen. ?  ?  ? Relevant Medications  ? azithromycin (ZITHROMAX) 250 MG tablet  ? ?  ?I discussed the assessment and treatment plan with the patient. The patient was provided an opportunity to ask questions and all were answered. The patient agreed with the plan and demonstrated an understanding of the instructions. ?  ?The patient was advised to call back or seek an in-person evaluation if the symptoms worsen or if the condition fails to improve as anticipated. ? ?Time spent on call:  8 minutes with patient face to face via video conference. More than 50% of this time was spent in counseling and coordination of care. 5 minutes total spent in review of patient's record and preparation of their chart. ? ? ?Charyl Dancer, NP  ?

## 2021-04-06 NOTE — Patient Instructions (Signed)
It was great to see you! ? ?Start prednisone taper, 6 tablets today, 5 tablets tomorrow then decrease by 1 every day until gone.  Take this in the morning with food.  Start azithromycin 2 tablets today, then 1 tablet daily until gone.  Make sure you are drinking plenty of fluids and getting rest.  Wear a mask if you need to go out in public. ? ?Let's follow-up if your symptoms do not improve or worsen ? ?Take care, ? ?Vance Peper, NP ? ?

## 2021-04-28 ENCOUNTER — Encounter: Payer: Self-pay | Admitting: Gastroenterology

## 2021-05-04 ENCOUNTER — Ambulatory Visit: Payer: BC Managed Care – PPO | Admitting: Anesthesiology

## 2021-05-04 ENCOUNTER — Ambulatory Visit: Payer: BC Managed Care – PPO

## 2021-05-04 ENCOUNTER — Ambulatory Visit
Admission: RE | Disposition: A | Discharge status: Home / Self Care | Payer: Self-pay | Source: Home / Self Care | Attending: Gastroenterology

## 2021-05-04 ENCOUNTER — Encounter: Payer: Self-pay | Admitting: Gastroenterology

## 2021-05-04 ENCOUNTER — Other Ambulatory Visit: Payer: Self-pay

## 2021-05-04 ENCOUNTER — Ambulatory Visit
Admission: RE | Admit: 2021-05-04 | Discharge: 2021-05-04 | Disposition: A | Discharge status: Home / Self Care | Payer: BC Managed Care – PPO | Attending: Gastroenterology | Admitting: Gastroenterology

## 2021-05-04 DIAGNOSIS — F32A Depression, unspecified: Secondary | ICD-10-CM | POA: Diagnosis not present

## 2021-05-04 DIAGNOSIS — K5730 Diverticulosis of large intestine without perforation or abscess without bleeding: Secondary | ICD-10-CM | POA: Diagnosis not present

## 2021-05-04 DIAGNOSIS — R0989 Other specified symptoms and signs involving the circulatory and respiratory systems: Secondary | ICD-10-CM | POA: Diagnosis not present

## 2021-05-04 DIAGNOSIS — Z86010 Personal history of colonic polyps: Secondary | ICD-10-CM | POA: Diagnosis not present

## 2021-05-04 DIAGNOSIS — K317 Polyp of stomach and duodenum: Secondary | ICD-10-CM

## 2021-05-04 DIAGNOSIS — Z8616 Personal history of COVID-19: Secondary | ICD-10-CM | POA: Insufficient documentation

## 2021-05-04 DIAGNOSIS — K219 Gastro-esophageal reflux disease without esophagitis: Secondary | ICD-10-CM | POA: Insufficient documentation

## 2021-05-04 DIAGNOSIS — I10 Essential (primary) hypertension: Secondary | ICD-10-CM | POA: Insufficient documentation

## 2021-05-04 DIAGNOSIS — Z6841 Body Mass Index (BMI) 40.0 and over, adult: Secondary | ICD-10-CM | POA: Diagnosis not present

## 2021-05-04 DIAGNOSIS — Z1211 Encounter for screening for malignant neoplasm of colon: Secondary | ICD-10-CM | POA: Diagnosis not present

## 2021-05-04 DIAGNOSIS — K635 Polyp of colon: Secondary | ICD-10-CM | POA: Insufficient documentation

## 2021-05-04 HISTORY — PX: COLONOSCOPY WITH PROPOFOL: SHX5780

## 2021-05-04 HISTORY — PX: ESOPHAGOGASTRODUODENOSCOPY: SHX5428

## 2021-05-04 SURGERY — COLONOSCOPY WITH PROPOFOL
Anesthesia: General

## 2021-05-04 MED ORDER — ONDANSETRON HCL 4 MG/2ML IJ SOLN: 4.0000 mg | Freq: Once | INTRAMUSCULAR | Status: DC | PRN

## 2021-05-04 MED ORDER — ACETAMINOPHEN 160 MG/5ML PO SOLN: 325.0000 mg | ORAL | Status: DC | PRN

## 2021-05-04 MED ORDER — GLYCOPYRROLATE 0.2 MG/ML IJ SOLN
INTRAMUSCULAR | Status: DC | PRN
  Administered 2021-05-04: .1 mg via INTRAVENOUS

## 2021-05-04 MED ORDER — LACTATED RINGERS IV SOLN: INTRAVENOUS | Status: DC

## 2021-05-04 MED ORDER — STERILE WATER FOR IRRIGATION IR SOLN
Status: DC | PRN
  Administered 2021-05-04: 100 mL

## 2021-05-04 MED ORDER — ACETAMINOPHEN 325 MG PO TABS: 325.0000 mg | ORAL_TABLET | ORAL | Status: DC | PRN

## 2021-05-04 MED ORDER — SODIUM CHLORIDE 0.9 % IV SOLN: INTRAVENOUS | Status: DC

## 2021-05-04 MED ORDER — PROPOFOL 10 MG/ML IV BOLUS
INTRAVENOUS | Status: DC | PRN
  Administered 2021-05-04: 30 mg via INTRAVENOUS
  Administered 2021-05-04 (×2): 40 mg via INTRAVENOUS
  Administered 2021-05-04 (×2): 30 mg via INTRAVENOUS
  Administered 2021-05-04: 150 mg via INTRAVENOUS
  Administered 2021-05-04: 30 mg via INTRAVENOUS
  Administered 2021-05-04 (×2): 40 mg via INTRAVENOUS
  Administered 2021-05-04: 30 mg via INTRAVENOUS
  Administered 2021-05-04: 50 mg via INTRAVENOUS
  Administered 2021-05-04: 30 mg via INTRAVENOUS

## 2021-05-04 SURGICAL SUPPLY — 10 items
BLOCK BITE 60FR ADLT L/F GRN (MISCELLANEOUS) ×3 IMPLANT
FORCEPS BIOP RAD 4 LRG CAP 4 (CUTTING FORCEPS) ×1 IMPLANT
GOWN CVR UNV OPN BCK APRN NK (MISCELLANEOUS) ×8 IMPLANT
GOWN ISOL THUMB LOOP REG UNIV (MISCELLANEOUS) ×6
KIT PRC NS LF DISP ENDO (KITS) ×4 IMPLANT
KIT PROCEDURE OLYMPUS (KITS) ×6
MANIFOLD NEPTUNE II (INSTRUMENTS) ×5 IMPLANT
SNARE COLD EXACTO (MISCELLANEOUS) ×1 IMPLANT
TRAP ETRAP POLY (MISCELLANEOUS) ×1 IMPLANT
WATER STERILE IRR 250ML POUR (IV SOLUTION) ×6 IMPLANT

## 2021-05-04 NOTE — Op Note (Signed)
[facility] ?Gastroenterology ?Patient Name: Maxcine Strong ?Procedure Date: 05/04/2021 7:26 AM ?MRN: [id number] ?Account #: [id number] ?Date of Birth: December 07, 1960 ?Admit Type: Outpatient ?Age: 61 ?Room: [facility] OR ROOM 01 ?Gender: Female ?Note Status: Finalized ?Instrument Name: [id number] ?Procedure:             Colonoscopy ?Indications:           High risk colon cancer surveillance: Personal history  ?                       of colonic polyps, Last colonoscopy: September 2017 ?Providers:             Lin Landsman MD, MD ?Referring MD:          Charyl Dancer (Referring MD) ?Medicines:             General Anesthesia ?Complications:         No immediate complications. Estimated blood loss: None. ?Procedure:             Pre-Anesthesia Assessment: ?                       - Prior to the procedure, a History and Physical was  ?                       performed, and patient medications and allergies were  ?                       reviewed. The patient is competent. The risks and  ?                       benefits of the procedure and the sedation options and  ?                       risks were discussed with the patient. All questions  ?                       were answered and informed consent was obtained.  ?                       Patient identification and proposed procedure were  ?                       verified by the physician, the nurse, the  ?                       anesthesiologist, the anesthetist and the technician  ?                       in the pre-procedure area in the procedure room in the  ?                       endoscopy suite. Mental Status Examination: alert and  ?                       oriented. Airway Examination: normal oropharyngeal  ?                       airway and neck mobility. Respiratory Examination:  ?  clear to auscultation. CV Examination: normal.  ?                       Prophylactic Antibiotics: The patient does not require  ?                        prophylactic antibiotics. Prior Anticoagulants: The  ?                       patient has taken no previous anticoagulant or  ?                       antiplatelet agents. ASA Grade Assessment: II - A  ?                       patient with mild systemic disease. After reviewing  ?                       the risks and benefits, the patient was deemed in  ?                       satisfactory condition to undergo the procedure. The  ?                       anesthesia plan was to use general anesthesia.  ?                       Immediately prior to administration of medications,  ?                       the patient was re-assessed for adequacy to receive  ?                       sedatives. The heart rate, respiratory rate, oxygen  ?                       saturations, blood pressure, adequacy of pulmonary  ?                       ventilation, and response to care were monitored  ?                       throughout the procedure. The physical status of the  ?                       patient was re-assessed after the procedure. ?                       After obtaining informed consent, the colonoscope was  ?                       passed under direct vision. Throughout the procedure,  ?                       the patient's blood pressure, pulse, and oxygen  ?                       saturations were monitored continuously. The  ?  Colonoscope was introduced through the anus and  ?                       advanced to the the terminal ileum, with  ?                       identification of the appendiceal orifice and IC  ?                       valve. After obtaining informed consent, the  ?                       colonoscope was passed under direct vision. Throughout  ?                       the procedure, the patient's blood pressure, pulse,  ?                       and oxygen saturations were monitored continuously.  ?                       The colonoscopy was performed without difficulty. The  ?                        patient tolerated the procedure well. The quality of  ?                       the bowel preparation was evaluated using the BBPS  ?                       [facility] Bowel Preparation Scale) with scores of: Right  ?                       Colon = 3, Transverse Colon = 3 and Left Colon = 3  ?                       (entire mucosa seen well with no residual staining,  ?                       small fragments of stool or opaque liquid). The total  ?                       BBPS score equals 9. ?Findings: ?     The perianal and digital rectal examinations were normal. Pertinent  ?     negatives include normal sphincter tone and no palpable rectal lesions. ?     The terminal ileum appeared normal. ?     A 3 mm polyp was found in the descending colon. The polyp was sessile.  ?     The polyp was removed with a cold snare. Resection and retrieval were  ?     complete. ?     The retroflexed view of the distal rectum and anal verge was normal and  ?     showed no anal or rectal abnormalities. ?     Many diverticula were found in the sigmoid colon and descending colon. ?Impression:            - The examined portion of the ileum was normal. ?                       -  One 3 mm polyp in the descending colon, removed with  ?                       a cold snare. Resected and retrieved. ?                       - The distal rectum and anal verge are normal on  ?                       retroflexion view. ?                       - Diverticulosis in the sigmoid colon and in the  ?                       descending colon. ?Recommendation:        - Discharge patient to home (with escort). ?                       - Resume previous diet today. ?                       - Continue present medications. ?                       - Await pathology results. ?                       - Repeat colonoscopy in 7-10 years for surveillance  ?                       based on pathology results. ?Procedure Code(s):     --- Professional --- ?                       [phone],  Colonoscopy, flexible; with removal of  ?                       tumor(s), polyp(s), or other lesion(s) by snare  ?                       technique ?Diagnosis Code(s):     --- Professional --- ?                       Z86.010, Personal history of colonic polyps ?                       K63.5, Polyp of colon ?                       K57.30, Diverticulosis of large intestine without  ?                       perforation or abscess without bleeding ?CPT copyright 2019 [employer]. All rights reserved. ?The codes documented in this report are preliminary and upon coder review may  ?be revised to meet current compliance requirements. ?Dr. Ulyess Mort ?Iris Tatsch Raeanne Gathers MD, MD ?05/04/2021 8:37:42 AM ?This report has been signed electronically. ?Number of Addenda: 0 ?Note Initiated On: 05/04/2021 7:26 AM ?Scope Withdrawal Time: 0 hours 10 minutes 40 seconds  ?Total Procedure  Duration: 0 hours 12 minutes 19 seconds  ?Estimated Blood Loss:  Estimated blood loss: none. ?     [facility] ?

## 2021-05-04 NOTE — H&P (Addendum)
?Cephas Darby, MD ?[address]  ?[address]  ?[place], [place] [address]  ?Main: [phone]  ?Fax: [phone] ?Pager: [phone] ? ?Primary Care Physician:  Charyl Dancer, NP ?Primary Gastroenterologist:  Dr. Cephas Darby ? ?Pre-Procedure History & Physical: ?HPI:  CAMIRA GEIDEL is a 61 y.o. female is here for an EGD and a colonoscopy. ?  ?Past Medical History:  ?Diagnosis Date  ? Basal cell carcinoma 2022  ? COVID-19 03/31/2021  ? Depression   ? GERD (gastroesophageal reflux disease)   ? Heartburn   ? Hyperlipidemia   ? Hypertension   ? Pt denies  ? Wears contact lenses   ? ? ?Past Surgical History:  ?Procedure Laterality Date  ? COLONOSCOPY WITH PROPOFOL N/A 10/02/2015  ? Procedure: COLONOSCOPY WITH PROPOFOL;  Surgeon: Lucilla Lame, MD;  Location: [facility];  Service: Endoscopy;  Laterality: N/A;  ? KNEE SURGERY    ? POLYPECTOMY  10/02/2015  ? Procedure: POLYPECTOMY;  Surgeon: Lucilla Lame, MD;  Location: [facility];  Service: Endoscopy;;  ? TONSILLECTOMY  2006  ? ? ?Prior to Admission medications   ?Medication Sig Start Date End Date Taking? Authorizing Provider  ?aspirin EC 81 MG tablet Take 81 mg by mouth daily.   Yes [provider]  ?calcium carbonate (OS-CAL - DOSED IN MG OF ELEMENTAL CALCIUM) 1250 (500 Ca) MG tablet Take 1 tablet by mouth.   Yes [provider]  ?cholecalciferol (VITAMIN D3) 25 MCG (1000 UNIT) tablet Take 1,000 Units by mouth daily.   Yes [provider]  ?Cranberry 500 MG CAPS Take one by mouth daily   Yes [provider]  ?hydrochlorothiazide (HYDRODIURIL) 25 MG tablet Take 1 tablet (25 mg total) by mouth daily. 03/22/21  Yes McElwee, Scheryl Darter, NP  ?Omega-3 Fatty Acids (FISH OIL) 1000 MG CAPS Take by mouth daily.   Yes [provider]  ?omeprazole (PRILOSEC) 40 MG capsule  10/26/20  Yes [provider]  ?pantoprazole (PROTONIX) 20 MG tablet Take 1 tablet (20 mg total) by mouth daily. 03/22/21  Yes  McElwee, Lauren A, NP  ?simvastatin (ZOCOR) 10 MG tablet Take 1 tablet (10 mg total) by mouth at bedtime. 03/22/21  Yes McElwee, Lauren A, NP  ?tretinoin (RETIN-A) 0.025 % cream SMARTSIG:1 sparingly Topical Every Night 10/26/20  Yes [provider]  ?ALPRAZolam (XANAX) 0.5 MG tablet Take 1 tablet (0.5 mg total) by mouth at bedtime as needed for anxiety. 03/22/21   McElwee, Lauren A, NP  ?amLODipine (NORVASC) 5 MG tablet Take 1 tablet (5 mg total) by mouth daily. 03/22/21   McElwee, Lauren A, NP  ?buPROPion (WELLBUTRIN XL) 150 MG 24 hr tablet TAKE 1 TABLET BY MOUTH EVERY MORNING. GENERIC EQUIVALENT FOR WELLBUTRIN XL 03/22/21   McElwee, Scheryl Darter, NP  ?Calcium Carbonate Antacid 650 MG tablet Take 1 tablet by mouth daily. ?Patient not taking: Reported on 03/25/2021 10/26/20   [provider]  ?Calcium Carbonate-Vit D-Min (CALCIUM 1200 PO) Take 1 tablet by mouth daily. ?Patient not taking: Reported on 03/25/2021    [provider]  ?predniSONE (DELTASONE) 10 MG tablet Take 6 tablets today, then 5 tablets tomorrow, then decrease by 1 tablet every day until gone ?Patient not taking: Reported on 05/04/2021 04/06/21   Charyl Dancer, NP  ? ? ?Allergies as of 03/22/2021 - Review Complete 03/22/2021  ?Allergen Reaction Noted  ? Sulfa antibiotics Rash 06/09/2010  ? ? ?Family History  ?Problem Relation Age of Onset  ? Multiple  sclerosis Mother   ? Cancer Father 67  ?     stomach cancer died age 50  ? Breast cancer Maternal Grandmother 42  ? Drug abuse Brother   ? Kidney disease Neg Hx   ? Bladder Cancer Neg Hx   ? ? ?Social History  ? ?Socioeconomic History  ? Marital status: Married  ?  Spouse name: Not on file  ? Number of children: Not on file  ? Years of education: Not on file  ? Highest education level: Not on file  ?Occupational History  ? Not on file  ?Tobacco Use  ? Smoking status: Former  ?  Packs/day: 0.25  ?  Years: 4.00  ?  Pack years: 1.00  ?  Types: Cigarettes  ?  Quit date: 01/17/1985  ?  Years  since quitting: 36.3  ? Smokeless tobacco: Never  ?Vaping Use  ? Vaping Use: Never used  ?Substance and Sexual Activity  ? Alcohol use: Yes  ?  Alcohol/week: 2.0 standard drinks  ?  Types: 2 Glasses of wine per week  ?  Comment: occasionally  ? Drug use: No  ? Sexual activity: Not Currently  ?Other Topics Concern  ? Not on file  ?Social History Narrative  ? Not on file  ? ?Social Determinants of Health  ? ?Financial Resource Strain: Not on file  ?Food Insecurity: Not on file  ?Transportation Needs: Not on file  ?Physical Activity: Not on file  ?Stress: Not on file  ?Social Connections: Not on file  ?Intimate Partner Violence: Not on file  ? ? ?Review of Systems: ?See HPI, otherwise negative ROS ? ?Physical Exam: ?BP 135/89   Pulse 90   Temp (!) 97.3 ?F (36.3 ?C) (Temporal)   Resp 20   Ht '5\' 2"'$  (1.575 m)   Wt 98.4 kg   SpO2 97%   BMI 39.69 kg/m?  ?General:   Alert,  pleasant and cooperative in NAD ?Head:  Normocephalic and atraumatic. ?Neck:  Supple; no masses or thyromegaly. ?Lungs:  Clear throughout to auscultation.    ?Heart:  Regular rate and rhythm. ?Abdomen:  Soft, nontender and nondistended. Normal bowel sounds, without guarding, and without rebound.   ?Neurologic:  Alert and  oriented x4;  grossly normal neurologically. ? ?Impression/Plan: ?Deaysia D Doland is here for an EGD and a colonoscopy to be performed for h/o colon polyps, chronic GERD, barrett's screening ? ?Risks, benefits, limitations, and alternatives regarding EGD and colonoscopy have been reviewed with the patient.  Questions have been answered.  All parties agreeable. ? ? ?Sherri Sear, MD  05/04/2021, 7:55 AM ?

## 2021-05-04 NOTE — Op Note (Signed)
[facility] ?Gastroenterology ?Patient Name: Lyzbeth Genrich ?Procedure Date: 05/04/2021 8:02 AM ?MRN: [id number] ?Account #: [id number] ?Date of Birth: [birth year] ?Admit Type: Outpatient ?Age: 61 ?Room: [facility] OR ROOM 1 ?Gender: Female ?Note Status: Finalized ?Instrument Name: [id number] ?Procedure:             Upper GI endoscopy ?Indications:           Esophageal reflux symptoms that persist despite  ?                       appropriate therapy, Globus sensation ?Providers:             Lin Landsman MD, MD ?Referring MD:          Charyl Dancer (Referring MD) ?Medicines:             General Anesthesia ?Complications:         No immediate complications. Estimated blood loss: None. ?Procedure:             Pre-Anesthesia Assessment: ?                       - Prior to the procedure, a History and Physical was  ?                       performed, and patient medications and allergies were  ?                       reviewed. The patient is competent. The risks and  ?                       benefits of the procedure and the sedation options and  ?                       risks were discussed with the patient. All questions  ?                       were answered and informed consent was obtained.  ?                       Patient identification and proposed procedure were  ?                       verified by the physician, the nurse, the  ?                       anesthesiologist, the anesthetist and the technician  ?                       in the pre-procedure area in the procedure room in the  ?                       endoscopy suite. Mental Status Examination: alert and  ?                       oriented. Airway Examination: normal oropharyngeal  ?                       airway and neck mobility. Respiratory Examination:  ?  clear to auscultation. CV Examination: normal.  ?                       Prophylactic Antibiotics: The patient does not require  ?                       prophylactic  antibiotics. Prior Anticoagulants: The  ?                       patient has taken no previous anticoagulant or  ?                       antiplatelet agents. ASA Grade Assessment: II - A  ?                       patient with mild systemic disease. After reviewing  ?                       the risks and benefits, the patient was deemed in  ?                       satisfactory condition to undergo the procedure. The  ?                       anesthesia plan was to use general anesthesia.  ?                       Immediately prior to administration of medications,  ?                       the patient was re-assessed for adequacy to receive  ?                       sedatives. The heart rate, respiratory rate, oxygen  ?                       saturations, blood pressure, adequacy of pulmonary  ?                       ventilation, and response to care were monitored  ?                       throughout the procedure. The physical status of the  ?                       patient was re-assessed after the procedure. ?                       After obtaining informed consent, the endoscope was  ?                       passed under direct vision. Throughout the procedure,  ?                       the patient's blood pressure, pulse, and oxygen  ?                       saturations were monitored continuously. The was  ?  introduced through the mouth, and advanced to the  ?                       second part of duodenum. The upper GI endoscopy was  ?                       accomplished without difficulty. The patient tolerated  ?                       the procedure well. ?Findings: ?     The duodenal bulb and second portion of the duodenum were normal. ?     Multiple small pedunculated and sessile fundic gland polyps with no  ?     bleeding and no stigmata of recent bleeding were found in the gastric  ?     fundus and in the gastric body. ?     The cardia and gastric fundus were normal on retroflexion. ?      Esophagogastric landmarks were identified: the gastroesophageal junction  ?     was found at 36 cm from the incisors. ?     The gastroesophageal junction and examined esophagus were normal.  ?     Biopsies were taken with a cold forceps for histology. ?Impression:            - Normal duodenal bulb and second portion of the  ?                       duodenum. ?                       - Multiple fundic gland polyps. ?                       - Esophagogastric landmarks identified. ?                       - Normal gastroesophageal junction and esophagus.  ?                       Biopsied. ?Recommendation:        - Await pathology results. ?                       - Follow an antireflux regimen. ?                       - Continue present medications. ?                       - Proceed with colonoscopy as scheduled ?                       See colonoscopy report ?Procedure Code(s):     --- Professional --- ?                       [phone], Esophagogastroduodenoscopy, flexible,  ?                       transoral; with biopsy, single or multiple ?Diagnosis Code(s):     --- Professional --- ?  K31.7, Polyp of stomach and duodenum ?                       K21.9, Gastro-esophageal reflux disease without  ?                       esophagitis ?                       F45.8, Other somatoform disorders ?CPT copyright 2019 [employer]. All rights reserved. ?The codes documented in this report are preliminary and upon coder review may  ?be revised to meet current compliance requirements. ?Dr. Ulyess Mort ?Hudsyn Barich Raeanne Gathers MD, MD ?05/04/2021 8:20:42 AM ?This report has been signed electronically. ?Number of Addenda: 0 ?Note Initiated On: 05/04/2021 8:02 AM ?Total Procedure Duration: 0 hours 5 minutes 21 seconds  ?Estimated Blood Loss:  Estimated blood loss: none. ?     [facility] ?

## 2021-05-04 NOTE — Anesthesia Preprocedure Evaluation (Addendum)
Anesthesia Evaluation  ?Patient identified by MRN, date of birth, ID band ?Patient awake ? ? ? ?Reviewed: ?Allergy & Precautions, NPO status , Patient's Chart, lab work & pertinent test results ? ?History of Anesthesia Complications ?Negative for: history of anesthetic complications ? ?Airway ?Mallampati: II ? ?TM Distance: >3 FB ?Neck ROM: full ? ? ? Dental ?no notable dental hx. ? ?  ?Pulmonary ?neg pulmonary ROS, former smoker,  ?  ?Pulmonary exam normal ? ? ? ? ? ? ? Cardiovascular ?Exercise Tolerance: Good ?hypertension, Normal cardiovascular exam ? ? ?  ?Neuro/Psych ?Depression negative neurological ROS ?   ? GI/Hepatic ?Neg liver ROS, GERD  Controlled,History of colonic polyps  ?  ?Endo/Other  ?Morbid obesity (BMI 40) ? Renal/GU ?negative Renal ROS  ?negative genitourinary ?  ?Musculoskeletal ?negative musculoskeletal ROS ?(+)  ? Abdominal ?(+) + obese,   ?Peds ? Hematology ?negative hematology ROS ?(+)   ?Anesthesia Other Findings ? ? Reproductive/Obstetrics ? ?  ? ? ? ? ? ? ? ? ? ? ? ? ? ?  ?  ? ? ? ? ? ? ? ? ?Anesthesia Physical ? ?Anesthesia Plan ? ?ASA: 3 ? ?Anesthesia Plan: General  ? ?Post-op Pain Management: Minimal or no pain anticipated  ? ?Induction: Intravenous ? ?PONV Risk Score and Plan: 3 and TIVA, Propofol infusion and Treatment may vary due to age or medical condition ? ?Airway Management Planned: Nasal Cannula and Natural Airway ? ?Additional Equipment: None ? ?Intra-op Plan:  ? ?Post-operative Plan:  ? ?Informed Consent: I have reviewed the patients History and Physical, chart, labs and discussed the procedure including the risks, benefits and alternatives for the proposed anesthesia with the patient or authorized representative who has indicated his/her understanding and acceptance.  ? ? ? ?Dental advisory given ? ?Plan Discussed with: CRNA ? ?Anesthesia Plan Comments:   ? ? ? ? ? ? ?Anesthesia Quick Evaluation ? ?

## 2021-05-04 NOTE — Anesthesia Procedure Notes (Signed)
Date/Time: 05/04/2021 8:27 AM ?Performed by: Cameron Ali, CRNA ?Pre-anesthesia Checklist: Patient identified, Emergency Drugs available, Suction available, Timeout performed and Patient being monitored ?Patient Re-evaluated:Patient Re-evaluated prior to induction ?Oxygen Delivery Method: Nasal cannula ?Placement Confirmation: positive ETCO2 ? ? ? ? ?

## 2021-05-04 NOTE — Anesthesia Postprocedure Evaluation (Signed)
Anesthesia Post Note ? ?Patient: Hannah Ferguson ? ?Procedure(s) Performed: COLONOSCOPY WITH PROPOFOL ?ESOPHAGOGASTRODUODENOSCOPY (EGD) ? ? ?  ?Patient location during evaluation: PACU ?Anesthesia Type: General ?Level of consciousness: awake and alert ?Pain management: pain level controlled ?Vital Signs Assessment: post-procedure vital signs reviewed and stable ?Respiratory status: spontaneous breathing and nonlabored ventilation ?Cardiovascular status: blood pressure returned to baseline ?Postop Assessment: no apparent nausea or vomiting ?Anesthetic complications: no ? ? ?No notable events documented. ? ?Ardeth Sportsman ? ? ? ? ? ?

## 2021-05-04 NOTE — Transfer of Care (Signed)
Immediate Anesthesia Transfer of Care Note ? ?Patient: Hannah Ferguson ? ?Procedure(s) Performed: COLONOSCOPY WITH PROPOFOL ?ESOPHAGOGASTRODUODENOSCOPY (EGD) ? ?Patient Location: PACU ? ?Anesthesia Type: General ? ?Level of Consciousness: awake, alert  and patient cooperative ? ?Airway and Oxygen Therapy: Patient Spontanous Breathing and Patient connected to supplemental oxygen ? ?Post-op Assessment: Post-op Vital signs reviewed, Patient's Cardiovascular Status Stable, Respiratory Function Stable, Patent Airway and No signs of Nausea or vomiting ? ?Post-op Vital Signs: Reviewed and stable ? ?Complications: No notable events documented. ? ?

## 2021-05-05 ENCOUNTER — Encounter: Payer: Self-pay | Admitting: Gastroenterology

## 2021-05-05 LAB — SURGICAL PATHOLOGY

## 2021-05-18 ENCOUNTER — Ambulatory Visit: Payer: BC Managed Care – PPO

## 2021-05-24 ENCOUNTER — Ambulatory Visit
Admission: RE | Admit: 2021-05-24 | Discharge: 2021-05-24 | Disposition: A | Discharge status: Home / Self Care | Payer: BC Managed Care – PPO | Source: Ambulatory Visit | Attending: Nurse Practitioner | Admitting: Nurse Practitioner

## 2021-05-24 DIAGNOSIS — Z1231 Encounter for screening mammogram for malignant neoplasm of breast: Secondary | ICD-10-CM | POA: Diagnosis not present

## 2021-10-07 DIAGNOSIS — M1812 Unilateral primary osteoarthritis of first carpometacarpal joint, left hand: Secondary | ICD-10-CM | POA: Diagnosis not present

## 2021-10-07 DIAGNOSIS — M79645 Pain in left finger(s): Secondary | ICD-10-CM | POA: Diagnosis not present

## 2021-11-01 DIAGNOSIS — L814 Other melanin hyperpigmentation: Secondary | ICD-10-CM | POA: Diagnosis not present

## 2021-11-01 DIAGNOSIS — Z85828 Personal history of other malignant neoplasm of skin: Secondary | ICD-10-CM | POA: Diagnosis not present

## 2021-11-01 DIAGNOSIS — L570 Actinic keratosis: Secondary | ICD-10-CM | POA: Diagnosis not present

## 2021-11-01 DIAGNOSIS — L821 Other seborrheic keratosis: Secondary | ICD-10-CM | POA: Diagnosis not present

## 2021-11-01 DIAGNOSIS — D485 Neoplasm of uncertain behavior of skin: Secondary | ICD-10-CM | POA: Diagnosis not present

## 2021-11-01 DIAGNOSIS — L820 Inflamed seborrheic keratosis: Secondary | ICD-10-CM | POA: Diagnosis not present

## 2021-11-01 DIAGNOSIS — D2271 Melanocytic nevi of right lower limb, including hip: Secondary | ICD-10-CM | POA: Diagnosis not present

## 2022-02-05 ENCOUNTER — Encounter: Payer: Self-pay | Admitting: Nurse Practitioner

## 2022-02-07 MED ORDER — PANTOPRAZOLE SODIUM 40 MG PO TBEC: 40.0000 mg | DELAYED_RELEASE_TABLET | Freq: Every day | ORAL | 2 refills | Status: DC

## 2022-02-13 ENCOUNTER — Other Ambulatory Visit: Payer: Self-pay | Admitting: Nurse Practitioner

## 2022-02-13 DIAGNOSIS — K2100 Gastro-esophageal reflux disease with esophagitis, without bleeding: Secondary | ICD-10-CM

## 2022-02-13 DIAGNOSIS — I10 Essential (primary) hypertension: Secondary | ICD-10-CM

## 2022-03-25 ENCOUNTER — Encounter: Payer: Self-pay | Admitting: Nurse Practitioner

## 2022-03-25 ENCOUNTER — Telehealth: Payer: Self-pay

## 2022-03-25 ENCOUNTER — Ambulatory Visit (INDEPENDENT_AMBULATORY_CARE_PROVIDER_SITE_OTHER): Payer: 59 | Admitting: Nurse Practitioner

## 2022-03-25 VITALS — BP 118/86 | HR 71 | Temp 97.1°F | Ht 62.0 in | Wt 220.0 lb

## 2022-03-25 DIAGNOSIS — E538 Deficiency of other specified B group vitamins: Secondary | ICD-10-CM | POA: Diagnosis not present

## 2022-03-25 DIAGNOSIS — R7303 Prediabetes: Secondary | ICD-10-CM | POA: Diagnosis not present

## 2022-03-25 DIAGNOSIS — E7800 Pure hypercholesterolemia, unspecified: Secondary | ICD-10-CM | POA: Diagnosis not present

## 2022-03-25 DIAGNOSIS — Z1231 Encounter for screening mammogram for malignant neoplasm of breast: Secondary | ICD-10-CM

## 2022-03-25 DIAGNOSIS — K219 Gastro-esophageal reflux disease without esophagitis: Secondary | ICD-10-CM

## 2022-03-25 DIAGNOSIS — I10 Essential (primary) hypertension: Secondary | ICD-10-CM

## 2022-03-25 DIAGNOSIS — F320 Major depressive disorder, single episode, mild: Secondary | ICD-10-CM

## 2022-03-25 DIAGNOSIS — Z0000 Encounter for general adult medical examination without abnormal findings: Secondary | ICD-10-CM

## 2022-03-25 LAB — CBC WITH DIFFERENTIAL/PLATELET
Basophils Absolute: 0 10*3/uL (ref 0.0–0.1)
Basophils Relative: 0.6 % (ref 0.0–3.0)
Eosinophils Absolute: 0.2 10*3/uL (ref 0.0–0.7)
Eosinophils Relative: 3.7 % (ref 0.0–5.0)
HCT: 46.9 % — ABNORMAL HIGH (ref 36.0–46.0)
Hemoglobin: 15.9 g/dL — ABNORMAL HIGH (ref 12.0–15.0)
Lymphocytes Relative: 43.2 % (ref 12.0–46.0)
Lymphs Abs: 2.8 10*3/uL (ref 0.7–4.0)
MCHC: 33.8 g/dL (ref 30.0–36.0)
MCV: 90.3 fl (ref 78.0–100.0)
Monocytes Absolute: 0.5 10*3/uL (ref 0.1–1.0)
Monocytes Relative: 8.5 % (ref 3.0–12.0)
Neutro Abs: 2.8 10*3/uL (ref 1.4–7.7)
Neutrophils Relative %: 44 % (ref 43.0–77.0)
Platelets: 283 10*3/uL (ref 150.0–400.0)
RBC: 5.19 Mil/uL — ABNORMAL HIGH (ref 3.87–5.11)
RDW: 12.9 % (ref 11.5–15.5)
WBC: 6.4 10*3/uL (ref 4.0–10.5)

## 2022-03-25 LAB — LIPID PANEL
Cholesterol: 187 mg/dL (ref 0–200)
HDL: 74.1 mg/dL (ref >39.00)
LDL Cholesterol: 92 mg/dL (ref 0–99)
NonHDL: 112.8
Total CHOL/HDL Ratio: 3
Triglycerides: 103 mg/dL (ref 0.0–149.0)
VLDL: 20.6 mg/dL (ref 0.0–40.0)

## 2022-03-25 LAB — COMPREHENSIVE METABOLIC PANEL
ALT: 19 U/L (ref 0–35)
AST: 15 U/L (ref 0–37)
Albumin: 4 g/dL (ref 3.5–5.2)
Alkaline Phosphatase: 42 U/L (ref 39–117)
BUN: 17 mg/dL (ref 6–23)
CO2: 31 mEq/L (ref 19–32)
Calcium: 9.6 mg/dL (ref 8.4–10.5)
Chloride: 102 mEq/L (ref 96–112)
Creatinine, Ser: 0.99 mg/dL (ref 0.40–1.20)
GFR: 60.78 mL/min (ref >60.00)
Glucose, Bld: 100 mg/dL — ABNORMAL HIGH (ref 70–99)
Potassium: 3.5 mEq/L (ref 3.5–5.1)
Sodium: 143 mEq/L (ref 135–145)
Total Bilirubin: 0.6 mg/dL (ref 0.2–1.2)
Total Protein: 6.7 g/dL (ref 6.0–8.3)

## 2022-03-25 LAB — VITAMIN B12: Vitamin B-12: 288 pg/mL (ref 211–911)

## 2022-03-25 LAB — HEMOGLOBIN A1C: Hgb A1c MFr Bld: 5.7 % (ref 4.6–6.5)

## 2022-03-25 LAB — TSH: TSH: 2.8 u[IU]/mL (ref 0.35–5.50)

## 2022-03-25 NOTE — Progress Notes (Signed)
BP 118/86 (BP Location: Right Arm)   Pulse 71   Temp (!) 97.1 F (36.2 C)   Ht 5\' 2"  (1.575 m)   Wt 220 lb (99.8 kg)   SpO2 98%   BMI 40.24 kg/m    Subjective:    Patient ID: Hannah Ferguson, female    DOB: November 18, 1960, 62 y.o.   MRN: [id number]  CC: Chief Complaint  Patient presents with   Annual Exam    with fasting labs, no concerns    HPI: Hannah Ferguson is a 62 y.o. female presenting on 03/25/2022 for comprehensive medical examination. Current medical complaints include:none  She currently lives with: husband Menopausal Symptoms: no  Depression and Anxiety Screen done today and results listed below:     03/25/2022    9:39 AM 03/25/2022    9:28 AM 03/22/2021    9:08 AM 03/22/2021    8:25 AM 02/12/2019    8:04 AM  Depression screen PHQ 2/9  Decreased Interest 0 0 0 1 0  Down, Depressed, Hopeless 0 0 0 0 0  PHQ - 2 Score 0 0 0 1 0  Altered sleeping 0  0    Tired, decreased energy 0  0    Change in appetite 0  0    Feeling bad or failure about yourself  0  0    Trouble concentrating 0  0    Moving slowly or fidgety/restless 0  0    Suicidal thoughts 0  0    PHQ-9 Score 0  0        03/25/2022    9:40 AM 03/22/2021    9:07 AM  GAD 7 : Generalized Anxiety Score  Nervous, Anxious, on Edge 0 0  Control/stop worrying 0 0  Worry too much - different things 1 0  Trouble relaxing 0 0  Restless 0 0  Easily annoyed or irritable 0 0  Afraid - awful might happen 0 0  Total GAD 7 Score 1 0  Anxiety Difficulty Not difficult at all     The patient does not have a history of falls. I did not complete a risk assessment for falls. A plan of care for falls was not documented.   Past Medical History:  Past Medical History:  Diagnosis Date   Basal cell carcinoma 2022   COVID-19 03/31/2021   Depression    GERD (gastroesophageal reflux disease)    Heartburn    Hyperlipidemia    Hypertension    Pt denies   Wears contact lenses     Surgical History:  Past Surgical History:   Procedure Laterality Date   COLONOSCOPY WITH PROPOFOL N/A 10/02/2015   Procedure: COLONOSCOPY WITH PROPOFOL;  Surgeon: Lucilla Lame, MD;  Location: [facility];  Service: Endoscopy;  Laterality: N/A;   COLONOSCOPY WITH PROPOFOL N/A 05/04/2021   Procedure: COLONOSCOPY WITH PROPOFOL;  Surgeon: Lin Landsman, MD;  Location: [facility];  Service: Endoscopy;  Laterality: N/A;   ESOPHAGOGASTRODUODENOSCOPY  05/04/2021   Procedure: ESOPHAGOGASTRODUODENOSCOPY (EGD);  Surgeon: Lin Landsman, MD;  Location: [facility];  Service: Endoscopy;;   KNEE SURGERY     POLYPECTOMY  10/02/2015   Procedure: POLYPECTOMY;  Surgeon: Lucilla Lame, MD;  Location: [facility];  Service: Endoscopy;;   TONSILLECTOMY  2006    Medications:  Current Outpatient Medications on File Prior to Visit  Medication Sig   ALPRAZolam (XANAX) 0.5 MG tablet Take 1 tablet (0.5 mg total) by mouth at bedtime  as needed for anxiety.   amLODipine (NORVASC) 5 MG tablet Take 1 tablet by mouth daily.   aspirin EC 81 MG tablet Take 81 mg by mouth daily.   buPROPion (WELLBUTRIN XL) 150 MG 24 hr tablet Take 1 tablet by mouth every morning. **Generic equivalent for wellbutrin XL.**   calcium carbonate (OS-CAL - DOSED IN MG OF ELEMENTAL CALCIUM) 1250 (500 Ca) MG tablet Take 1 tablet by mouth.   Cranberry 500 MG CAPS Take one by mouth daily   hydrochlorothiazide (HYDRODIURIL) 25 MG tablet Take 1 tablet by mouth daily.   pantoprazole (PROTONIX) 20 MG tablet Take 1 tablet by mouth daily.   simvastatin (ZOCOR) 10 MG tablet Take 1 tablet (10 mg total) by mouth at bedtime.   tretinoin (RETIN-A) 0.025 % cream SMARTSIG:1 sparingly Topical Every Night   cholecalciferol (VITAMIN D3) 25 MCG (1000 UNIT) tablet Take 1,000 Units by mouth daily. (Patient not taking: Reported on 03/25/2022)   No current facility-administered medications on file prior to visit.    Allergies:  Allergies  Allergen Reactions   Sulfa  Antibiotics Rash    Social History:  Social History   Socioeconomic History   Marital status: Married    Spouse name: Not on file   Number of children: Not on file   Years of education: Not on file   Highest education level: Not on file  Occupational History   Not on file  Tobacco Use   Smoking status: Former    Packs/day: 0.25    Years: 4.00    Additional pack years: 0.00    Total pack years: 1.00    Types: Cigarettes    Quit date: 01/17/1985    Years since quitting: 37.2   Smokeless tobacco: Never  Vaping Use   Vaping Use: Never used  Substance and Sexual Activity   Alcohol use: Yes    Alcohol/week: 2.0 standard drinks of alcohol    Types: 2 Glasses of wine per week    Comment: occasionally   Drug use: No   Sexual activity: Not Currently  Other Topics Concern   Not on file  Social History Narrative   Not on file   Social Determinants of Health   Financial Resource Strain: Not on file  Food Insecurity: No Food Insecurity (03/25/2022)   Hunger Vital Sign    Worried About Running Out of Food in the Last Year: Never true    Ran Out of Food in the Last Year: Never true  Transportation Needs: No Transportation Needs (03/25/2022)   PRAPARE - Hydrologist (Medical): No    Lack of Transportation (Non-Medical): No  Physical Activity: Not on file  Stress: Not on file  Social Connections: Not on file  Intimate Partner Violence: Not on file   Social History   Tobacco Use  Smoking Status Former   Packs/day: 0.25   Years: 4.00   Additional pack years: 0.00   Total pack years: 1.00   Types: Cigarettes   Quit date: 01/17/1985   Years since quitting: 37.2  Smokeless Tobacco Never   Social History   Substance and Sexual Activity  Alcohol Use Yes   Alcohol/week: 2.0 standard drinks of alcohol   Types: 2 Glasses of wine per week   Comment: occasionally    Family History:  Family History  Problem Relation Age of Onset   Multiple  sclerosis Mother    Cancer Father 36       stomach cancer died age  68   Breast cancer Maternal Grandmother 78   Drug abuse Brother    Kidney disease Neg Hx    Bladder Cancer Neg Hx     Past medical history, surgical history, medications, allergies, family history and social history reviewed with patient today and changes made to appropriate areas of the chart.   Review of Systems  Constitutional: Negative.   HENT: Negative.    Eyes: Negative.   Respiratory: Negative.    Cardiovascular: Negative.   Gastrointestinal: Negative.   Genitourinary: Negative.   Musculoskeletal: Negative.   Skin: Negative.   Neurological: Negative.   Psychiatric/Behavioral: Negative.     All other ROS negative except what is listed above and in the HPI.      Objective:    BP 118/86 (BP Location: Right Arm)   Pulse 71   Temp (!) 97.1 F (36.2 C)   Ht 5\' 2"  (1.575 m)   Wt 220 lb (99.8 kg)   SpO2 98%   BMI 40.24 kg/m   Wt Readings from Last 3 Encounters:  03/25/22 220 lb (99.8 kg)  05/04/21 217 lb (98.4 kg)  03/22/21 224 lb 3.2 oz (101.7 kg)    Physical Exam Vitals and nursing note reviewed.  Constitutional:      General: She is not in acute distress.    Appearance: Normal appearance.  HENT:     Head: Normocephalic and atraumatic.     Right Ear: Tympanic membrane, ear canal and external ear normal.     Left Ear: Tympanic membrane, ear canal and external ear normal.  Eyes:     Conjunctiva/sclera: Conjunctivae normal.  Cardiovascular:     Rate and Rhythm: Normal rate and regular rhythm.     Pulses: Normal pulses.     Heart sounds: Normal heart sounds.  Pulmonary:     Effort: Pulmonary effort is normal.     Breath sounds: Normal breath sounds.  Abdominal:     Palpations: Abdomen is soft.     Tenderness: There is no abdominal tenderness.  Musculoskeletal:        General: Normal range of motion.     Cervical back: Normal range of motion and neck supple.     Right lower leg: No edema.      Left lower leg: No edema.  Lymphadenopathy:     Cervical: No cervical adenopathy.  Skin:    General: Skin is warm and dry.  Neurological:     General: No focal deficit present.     Mental Status: She is alert and oriented to person, place, and time.     Cranial Nerves: No cranial nerve deficit.     Coordination: Coordination normal.     Gait: Gait normal.  Psychiatric:        Mood and Affect: Mood normal.        Behavior: Behavior normal.        Thought Content: Thought content normal.        Judgment: Judgment normal.     Results for orders placed or performed during the hospital encounter of 05/04/21  Surgical pathology  Result Value Ref Range   SURGICAL PATHOLOGY      SURGICAL PATHOLOGY CASE: [id number] PATIENT: [facility] Surgical Pathology Report     Specimen Submitted: A. Esophagus, random; cold forceps B. Colon polyp, descending; cold snare  Clinical History: Hx of colonic polyps Z86.010. gastric polyps/ colon polyp.    DIAGNOSIS: A. ESOPHAGUS, RANDOM; COLD FORCEPS: - BENIGN SQUAMOUS MUCOSA WITH NO SIGNIFICANT  HISTOPATHOLOGIC CHANGE. - NO INCREASE IN INTRAEPITHELIAL EOSINOPHILS (LESS THAN 2 PER HPF). - NEGATIVE FOR DYSPLASIA AND MALIGNANCY.  B. COLON POLYP, DESCENDING; COLD SNARE: - POLYPOID FRAGMENT OF BENIGN COLONIC MUCOSA WITH SUPERFICIAL REACTIVE/HYPERPLASTIC CHANGES AND PROMINENT LYMPHOID AGGREGATE. - NEGATIVE FOR DYSPLASIA AND MALIGNANCY.  Comment: Multiple additional deeper recut levels were examined.  GROSS DESCRIPTION: A. Labeled: Random esophageal biopsy cold forceps Received: Formalin Collection time: 8:16 AM on 05/04/2021 Placed into formalin time: 8:16 AM on 05/04/2021 Tissue fragment(s): Multipl e Size: Aggregate, 1.8 x 0.3 x 0.1 cm Description: Pale pink soft tissue fragments Entirely submitted in 1 cassette.  B. Labeled: Descending colon polyp cold snare Received: Formalin Collection time: 8:31 AM on 05/04/2021 Placed into  formalin time: 8:31 AM on 05/04/2021 Tissue fragment(s): 1 Size: 0.5 x 0.2 x 0.1 cm Description: Tan soft tissue fragment Entirely submitted in 1 cassette.  CM 05/04/2021  Final Diagnosis performed by Allena Napoleon, MD.   Electronically signed 05/05/2021 3:29:06PM The electronic signature indicates that the named Attending Pathologist has evaluated the specimen Technical component performed at [facility], [address] [place], [place] [address] Lab: [phone] Dir: Rush Farmer, MD, MMM  Professional component performed at [facility], [facility], [place], [place], [place] [address] Lab: [phone] Dir: Kathi Simpers, MD       Assessment & Plan:   Problem List Items Addressed This Visit       Cardiovascular and Mediastinum   Hypertension    Chronic, stable.  BP today 118/86.  Continue amlodipine 5 mg daily and HCTZ 25 mg daily.  Follow-up in 6 months.  Check CMP, CBC, lipid panel today.      Relevant Orders   CBC with Differential/Platelet   Comprehensive metabolic panel   Lipid panel     Digestive   Chronic GERD    Chronic, stable.  Continue Protonix 20 mg daily.  She can increase to 40 mg if symptoms worsen for a few days, then go back down to 20 mg.  Follow-up in 6 months.        Other   Hyperlipidemia    Chronic, stable.  Continue simvastatin 10 mg daily.  Check CMP, CBC, lipid panel today.  Follow-up in 6 months.      Relevant Orders   CBC with Differential/Platelet   Comprehensive metabolic panel   Lipid panel   Routine general medical examination at a health care facility - Primary    Health maintenance reviewed and updated. Discussed nutrition, exercise. Check CMP, CBC, TSH today. Follow-up 1 year.        Relevant Orders   CBC with Differential/Platelet   Comprehensive metabolic panel   TSH   Depression    Chronic, stable.  Continue Wellbutrin 150 mg daily.  Follow-up in 6 months or sooner with concerns.      B12 deficiency     Will check vitamin B12 levels today.  She was having some burning/tingling sensations on her scalp for the last few months.  Treat based on results.      Relevant Orders   Vitamin B12   Morbid obesity ([place])    BMI 40.2.  She does not currently exercise.  Discussed nutrition/exercise and information given.      Prediabetes    Last A1c was 5.7%.  Will check A1c today.      Relevant Orders   Hemoglobin A1c   Other Visit Diagnoses     Encounter for screening mammogram for malignant neoplasm of breast  Mammogram ordered today   Relevant Orders   MM 3D SCREENING MAMMOGRAM BILATERAL BREAST        Follow up plan: Return in about 6 months (around 09/25/2022) for HTN, HLD, Depression.   LABORATORY TESTING:  - Pap smear: up to date  IMMUNIZATIONS:   - Tdap: Tetanus vaccination status reviewed: last tetanus booster within 10 years. - Influenza: Up to date - Pneumovax: Not applicable - Prevnar: Not applicable - HPV: Not applicable - Zostavax vaccine: Up to date  SCREENING: -Mammogram: Up to date  - Colonoscopy: Up to date  - Bone Density: Not applicable  -Hearing Test: Not applicable  -Spirometry: Not applicable   PATIENT COUNSELING:   Advised to take 1 mg of folate supplement per day if capable of pregnancy.   Sexuality: Discussed sexually transmitted diseases, partner selection, use of condoms, avoidance of unintended pregnancy  and contraceptive alternatives.   Advised to avoid cigarette smoking.  I discussed with the patient that most people either abstain from alcohol or drink within safe limits (<=14/week and <=4 drinks/occasion for males, <=7/weeks and <= 3 drinks/occasion for females) and that the risk for alcohol disorders and other health effects rises proportionally with the number of drinks per week and how often a drinker exceeds daily limits.  Discussed cessation/primary prevention of drug use and availability of treatment for abuse.   Diet:  Encouraged to adjust caloric intake to maintain  or achieve ideal body weight, to reduce intake of dietary saturated fat and total fat, to limit sodium intake by avoiding high sodium foods and not adding table salt, and to maintain adequate dietary potassium and calcium preferably from fresh fruits, vegetables, and low-fat dairy products.    stressed the importance of regular exercise  Injury prevention: Discussed safety belts, safety helmets, smoke detector, smoking near bedding or upholstery.   Dental health: Discussed importance of regular tooth brushing, flossing, and dental visits.    NEXT PREVENTATIVE PHYSICAL DUE IN 1 YEAR. Return in about 6 months (around 09/25/2022) for HTN, HLD, Depression.

## 2022-03-25 NOTE — Assessment & Plan Note (Signed)
Health maintenance reviewed and updated. Discussed nutrition, exercise. Check CMP, CBC, TSH today. Follow-up 1 year.   

## 2022-03-25 NOTE — Assessment & Plan Note (Signed)
Chronic, stable.  Continue Protonix 20 mg daily.  She can increase to 40 mg if symptoms worsen for a few days, then go back down to 20 mg.  Follow-up in 6 months.

## 2022-03-25 NOTE — Patient Instructions (Signed)
Visit Information  Thank you for taking time to visit with me today. Please don't hesitate to contact me if I can be of assistance to you.   Following are the goals we discussed today:   Goals Addressed             This Visit's Progress    COMPLETED: Care Coordination Activities-No follow up required       Care Coordination Interventions: Advised patient to Annual Wellness exam. Discussed [facility] services and support. Assessed SDOH. Advised to discuss with primary care physician if services needed in the future.           If you are experiencing a Mental Health or [place] or need someone to talk to, please call the Suicide and Crisis Lifeline: 988   Patient verbalizes understanding of instructions and care plan provided today and agrees to view in [place]. Active MyChart status and patient understanding of how to access instructions and care plan via MyChart confirmed with patient.     The patient has been provided with contact information for the care management team and has been advised to call with any health related questions or concerns.   Jone Baseman, RN, MSN [place] [facility] Care Management Coordinator Direct Line [phone]

## 2022-03-25 NOTE — Patient Outreach (Signed)
  Care Coordination   In Person Provider Office Visit Note   03/25/2022 Name: Hannah Ferguson MRN: [id number] DOB: June 22, 1960  Hannah Ferguson is a 62 y.o. year old female who sees McElwee, Lauren A, NP for primary care. I engaged with Hannah Ferguson in the providers office today.  What matters to the patients health and wellness today?  None today    Goals Addressed             This Visit's Progress    COMPLETED: Care Coordination Activities-No follow up required       Care Coordination Interventions: Advised patient to Annual Wellness exam. Discussed [facility] services and support. Assessed SDOH. Advised to discuss with primary care physician if services needed in the future.         SDOH assessments and interventions completed:  Yes  SDOH Interventions Today    Flowsheet Row Most Recent Value  SDOH Interventions   Food Insecurity Interventions Intervention Not Indicated  Transportation Interventions Intervention Not Indicated        Care Coordination Interventions:  Yes, provided   Follow up plan: No further intervention required.   Encounter Outcome:  Pt. Visit Completed   Jone Baseman, RN, MSN [place] [facility] Care Management Coordinator Direct Line [phone]

## 2022-03-25 NOTE — Assessment & Plan Note (Signed)
BMI 40.2.  She does not currently exercise.  Discussed nutrition/exercise and information given.

## 2022-03-25 NOTE — Assessment & Plan Note (Signed)
Will check vitamin B12 levels today.  She was having some burning/tingling sensations on her scalp for the last few months.  Treat based on results.

## 2022-03-25 NOTE — Patient Instructions (Signed)
It was great to see you!  We are checking your labs today and will let you know the results via mychart/phone.   Let me know if you need any refills.   Let's follow-up in 6 months, sooner if you have concerns.  If a referral was placed today, you will be contacted for an appointment. Please note that routine referrals can sometimes take up to 3-4 weeks to process. Please call our office if you haven't heard anything after this time frame.  Take care,  Symphani Eckstrom, NP  

## 2022-03-25 NOTE — Assessment & Plan Note (Signed)
Chronic, stable.  BP today 118/86.  Continue amlodipine 5 mg daily and HCTZ 25 mg daily.  Follow-up in 6 months.  Check CMP, CBC, lipid panel today.

## 2022-03-25 NOTE — Assessment & Plan Note (Signed)
Last A1c was 5.7%.  Will check A1c today.

## 2022-03-25 NOTE — Assessment & Plan Note (Signed)
Chronic, stable.  Continue Wellbutrin 150 mg daily.  Follow-up in 6 months or sooner with concerns.

## 2022-03-25 NOTE — Assessment & Plan Note (Signed)
Chronic, stable.  Continue simvastatin 10 mg daily.  Check CMP, CBC, lipid panel today.  Follow-up in 6 months.

## 2022-03-29 ENCOUNTER — Encounter: Payer: Self-pay | Admitting: Nurse Practitioner

## 2022-03-29 DIAGNOSIS — F419 Anxiety disorder, unspecified: Secondary | ICD-10-CM

## 2022-04-08 ENCOUNTER — Other Ambulatory Visit: Payer: Self-pay | Admitting: Nurse Practitioner

## 2022-04-08 DIAGNOSIS — I10 Essential (primary) hypertension: Secondary | ICD-10-CM

## 2022-05-02 ENCOUNTER — Other Ambulatory Visit: Payer: Self-pay | Admitting: Nurse Practitioner

## 2022-05-02 DIAGNOSIS — K2100 Gastro-esophageal reflux disease with esophagitis, without bleeding: Secondary | ICD-10-CM

## 2022-05-03 NOTE — Telephone Encounter (Signed)
Requesting: Pantoprazole Sodium  Delayed-Release Tablet  Last Visit: 03/25/2022 Next Visit: Visit date not found Last Refill: 02/14/2022  Please Advise

## 2022-05-06 ENCOUNTER — Other Ambulatory Visit: Payer: Self-pay | Admitting: Nurse Practitioner

## 2022-05-06 NOTE — Telephone Encounter (Signed)
I called and LVM for patient to return call.  I wanted to clarify if she wanted this refill sent to [facility] pharmacy. We recently received refill request and sent Rx to [facility]

## 2022-05-26 ENCOUNTER — Ambulatory Visit
Admission: RE | Admit: 2022-05-26 | Discharge: 2022-05-26 | Disposition: A | Discharge status: Home / Self Care | Payer: 59 | Source: Ambulatory Visit | Attending: Nurse Practitioner | Admitting: Nurse Practitioner

## 2022-05-26 DIAGNOSIS — Z1231 Encounter for screening mammogram for malignant neoplasm of breast: Secondary | ICD-10-CM | POA: Diagnosis not present

## 2022-06-03 ENCOUNTER — Encounter: Payer: Self-pay | Admitting: Nurse Practitioner

## 2022-06-20 ENCOUNTER — Other Ambulatory Visit: Payer: Self-pay | Admitting: Nurse Practitioner

## 2022-06-20 DIAGNOSIS — G479 Sleep disorder, unspecified: Secondary | ICD-10-CM

## 2022-06-21 NOTE — Telephone Encounter (Signed)
Refill request for  Alaprazolam 0.5 mg LR  03/22/21, #30, 0  LOV 03/25/22 FOV  none scheduled.   Please review and advise.  Thanks. Dm/cma

## 2022-06-22 MED ORDER — ALPRAZOLAM 0.5 MG PO TABS: 0.5000 mg | ORAL_TABLET | Freq: Every evening | ORAL | 0 refills | Status: DC | PRN

## 2022-08-18 ENCOUNTER — Other Ambulatory Visit: Payer: Self-pay | Admitting: Nurse Practitioner

## 2022-08-18 ENCOUNTER — Encounter: Payer: Self-pay | Admitting: Nurse Practitioner

## 2022-08-18 DIAGNOSIS — I10 Essential (primary) hypertension: Secondary | ICD-10-CM

## 2022-08-19 MED ORDER — SIMVASTATIN 10 MG PO TABS: 10.0000 mg | ORAL_TABLET | Freq: Every day | ORAL | 3 refills | Status: DC

## 2022-08-19 NOTE — Telephone Encounter (Signed)
Please review and advise. Dm/cma  

## 2022-09-23 ENCOUNTER — Other Ambulatory Visit: Payer: Self-pay | Admitting: Nurse Practitioner

## 2022-09-23 DIAGNOSIS — K2100 Gastro-esophageal reflux disease with esophagitis, without bleeding: Secondary | ICD-10-CM

## 2022-09-23 NOTE — Telephone Encounter (Signed)
Requesting:  Pantoprazole Sodium 20mg  Delayed-Release Tablet  Last Visit: 03/25/2022 Next Visit: Visit date not found Last Refill: 05/03/2022  Please Advise

## 2022-10-25 ENCOUNTER — Other Ambulatory Visit: Payer: Self-pay | Admitting: Nurse Practitioner

## 2022-10-25 DIAGNOSIS — I10 Essential (primary) hypertension: Secondary | ICD-10-CM

## 2022-10-25 NOTE — Telephone Encounter (Signed)
Requesting: Amlodipine Besylate 5mg  Tablet  Last Visit: 03/25/2022 Next Visit: Visit date not found Last Refill: 08/18/2022  Please Advise

## 2022-10-26 NOTE — Telephone Encounter (Signed)
Called pt at 2:58 lvm to call back to schedule.

## 2022-11-01 ENCOUNTER — Other Ambulatory Visit: Payer: Self-pay | Admitting: Nurse Practitioner

## 2022-11-01 DIAGNOSIS — I10 Essential (primary) hypertension: Secondary | ICD-10-CM

## 2022-11-01 NOTE — Telephone Encounter (Signed)
Requesting: Hydrochlorothiazide 25mg  Tablet  Last Visit: 03/25/2022 Next Visit: Visit date not found Last Refill: 08/19/2022  Please Advise    Patient was to follow up in 6 months from last visit.

## 2022-11-02 ENCOUNTER — Other Ambulatory Visit: Payer: Self-pay | Admitting: Nurse Practitioner

## 2022-11-02 NOTE — Telephone Encounter (Signed)
Lvmtcb to schedule

## 2022-11-03 NOTE — Telephone Encounter (Signed)
Requesting:  Bupropion Hydrochloride 150mg  Extended-Release (XL) Tablet  Last Visit: 03/25/2022 Next Visit: Visit date not found Last Refill: 08/18/2022  Please Advise    Per last OV note patient was follow up in 6 months

## 2022-11-10 ENCOUNTER — Encounter: Payer: Self-pay | Admitting: Nurse Practitioner

## 2022-11-10 ENCOUNTER — Other Ambulatory Visit: Payer: Self-pay | Admitting: Nurse Practitioner

## 2022-11-10 DIAGNOSIS — I10 Essential (primary) hypertension: Secondary | ICD-10-CM

## 2022-11-10 NOTE — Telephone Encounter (Signed)
Requesting: Bupropion Hydrochloride 150mg  Extended-Release (XL) Tablet  Last Visit: 03/25/2022 Next Visit: Visit date not found Last Refill: 11/03/2022  Please Advise

## 2022-11-11 NOTE — Telephone Encounter (Signed)
Requesting: Hydrochlorothiazide 25mg  Tablet  Last Visit: 03/25/2022 Next Visit: 11/16/2022 Last Refill: 11/01/2022  Please Advise

## 2022-11-16 ENCOUNTER — Encounter: Payer: Self-pay | Admitting: Nurse Practitioner

## 2022-11-16 ENCOUNTER — Ambulatory Visit (INDEPENDENT_AMBULATORY_CARE_PROVIDER_SITE_OTHER): Payer: 59 | Admitting: Nurse Practitioner

## 2022-11-16 VITALS — BP 122/80 | HR 83 | Temp 97.5°F | Ht 62.0 in | Wt 222.0 lb

## 2022-11-16 DIAGNOSIS — I10 Essential (primary) hypertension: Secondary | ICD-10-CM | POA: Diagnosis not present

## 2022-11-16 DIAGNOSIS — E7800 Pure hypercholesterolemia, unspecified: Secondary | ICD-10-CM

## 2022-11-16 DIAGNOSIS — F320 Major depressive disorder, single episode, mild: Secondary | ICD-10-CM

## 2022-11-16 NOTE — Assessment & Plan Note (Signed)
Chronic, stable. Her blood pressure is well controlled at 122/80 today, without home monitoring. We will continue the current antihypertensive regimen including amlodipine 5mg  daily and hydrochlorothiazide 25mg  daily.

## 2022-11-16 NOTE — Progress Notes (Signed)
Established Patient Office Visit  Subjective   Patient ID: Hannah Ferguson, female    DOB: [birth year]  Age: 62 y.o. MRN: [id number]  Chief Complaint  Patient presents with   Medication Management    Rx Refills    HPI  Discussed the use of AI scribe software for clinical note transcription with the patient, who gave verbal consent to proceed.  History of Present Illness   The patient, with a history of hypertension and hyperlipidemia, presents for a routine follow-up. She recently recovered from her fourth bout of COVID-19, which she contracted during a trip to [place]. The illness lasted about eight days, with fever for three days. She is interested in boosting her immune system and is considering starting a multivitamin or vitamin C supplement. She currently takes calcium with vitamin D, but no multivitamin.  The patient is also considering tapering off her Wellbutrin, which she has been taking since 2013 for menopausal symptoms. She feels she could potentially stop the medication, but is aware of the need for a gradual taper.  She has not been checking her blood pressure at home. She denies chest pain, shortness of breath, dizziness, headaches, and leg swelling.        ROS See pertinent positives and negatives per HPI.    Objective:     BP 122/80 (BP Location: Right Arm)   Pulse 83   Temp (!) 97.5 F (36.4 C)   Ht 5\' 2"  (1.575 m)   Wt 222 lb (100.7 kg)   SpO2 97%   BMI 40.60 kg/m  BP Readings from Last 3 Encounters:  11/16/22 122/80  03/25/22 118/86  05/04/21 112/70   Wt Readings from Last 3 Encounters:  11/16/22 222 lb (100.7 kg)  03/25/22 220 lb (99.8 kg)  05/04/21 217 lb (98.4 kg)      Physical Exam Vitals and nursing note reviewed.  Constitutional:      General: She is not in acute distress.    Appearance: Normal appearance.  HENT:     Head: Normocephalic.  Eyes:     Conjunctiva/sclera: Conjunctivae normal.  Cardiovascular:     Rate and Rhythm:  Normal rate and regular rhythm.     Pulses: Normal pulses.     Heart sounds: Normal heart sounds.  Pulmonary:     Effort: Pulmonary effort is normal.     Breath sounds: Normal breath sounds.  Musculoskeletal:     Cervical back: Normal range of motion.  Skin:    General: Skin is warm.  Neurological:     General: No focal deficit present.     Mental Status: She is alert and oriented to person, place, and time.  Psychiatric:        Mood and Affect: Mood normal.        Behavior: Behavior normal.        Thought Content: Thought content normal.        Judgment: Judgment normal.    The 10-year ASCVD risk score (Arnett DK, et al., 2019) is: 4.5%    Assessment & Plan:   Problem List Items Addressed This Visit       Cardiovascular and Mediastinum   Hypertension - Primary    Chronic, stable. Her blood pressure is well controlled at 122/80 today, without home monitoring. We will continue the current antihypertensive regimen including amlodipine 5mg  daily and hydrochlorothiazide 25mg  daily.         Other   Hyperlipidemia    She is stable on Simvastatin.  After discussing the utility of coronary calcium scoring, we decided against it as she is already on statin therapy. We will continue Simvastatin 10mg  daily.       Depression    Stable on Wellbutrin since 2013, initiated during menopause, she discussed the possibility of discontinuation. We will taper Wellbutrin by taking it every other day for three weeks, then twice a week for three weeks, before stopping. She should monitor for mood changes and report any issues.      Return in about 4 months (around 03/16/2023).    Gerre Scull, NP

## 2022-11-16 NOTE — Patient Instructions (Signed)
It was great to see you!  Start taking bupropion every other day for 3 weeks, then twice a week for 3 weeks, then stop.   Let me know if you have any side effects or issues.   Let's follow-up in 4 months, sooner if you have concerns.  If a referral was placed today, you will be contacted for an appointment. Please note that routine referrals can sometimes take up to 3-4 weeks to process. Please call our office if you haven't heard anything after this time frame.  Take care,  Rodman Pickle, NP

## 2022-11-16 NOTE — Assessment & Plan Note (Signed)
She is stable on Simvastatin. After discussing the utility of coronary calcium scoring, we decided against it as she is already on statin therapy. We will continue Simvastatin 10mg  daily.

## 2022-11-16 NOTE — Assessment & Plan Note (Signed)
Stable on Wellbutrin since 2013, initiated during menopause, she discussed the possibility of discontinuation. We will taper Wellbutrin by taking it every other day for three weeks, then twice a week for three weeks, before stopping. She should monitor for mood changes and report any issues.

## 2023-01-04 ENCOUNTER — Other Ambulatory Visit: Payer: Self-pay | Admitting: Nurse Practitioner

## 2023-01-04 DIAGNOSIS — I10 Essential (primary) hypertension: Secondary | ICD-10-CM

## 2023-01-05 ENCOUNTER — Other Ambulatory Visit: Payer: Self-pay | Admitting: Nurse Practitioner

## 2023-01-05 DIAGNOSIS — K2100 Gastro-esophageal reflux disease with esophagitis, without bleeding: Secondary | ICD-10-CM

## 2023-01-05 MED ORDER — AMLODIPINE BESYLATE 5 MG PO TABS: 5.0000 mg | ORAL_TABLET | Freq: Every day | ORAL | 0 refills | Status: DC

## 2023-01-05 MED ORDER — PANTOPRAZOLE SODIUM 20 MG PO TBEC: 20.0000 mg | DELAYED_RELEASE_TABLET | Freq: Every day | ORAL | 0 refills | Status: DC

## 2023-01-05 MED ORDER — HYDROCHLOROTHIAZIDE 25 MG PO TABS: 25.0000 mg | ORAL_TABLET | Freq: Every day | ORAL | 0 refills | Status: DC

## 2023-01-05 NOTE — Telephone Encounter (Signed)
Requesting: pantoprazole (PROTONIX) 20 MG tablet  Last Visit: 11/16/2022 Next Visit: Visit date not found Last Refill: 09/23/2022  Please Advise

## 2023-01-05 NOTE — Telephone Encounter (Signed)
Requesting: amLODipine (NORVASC) 5 MG tablet , hydrochlorothiazide (HYDRODIURIL) 25 MG tablet  Last Visit: 11/16/2022 Next Visit: Visit date not found Last Refill: 10/25/2022, 11/01/2023  Please Advise

## 2023-01-13 ENCOUNTER — Other Ambulatory Visit: Payer: Self-pay | Admitting: Nurse Practitioner

## 2023-01-13 DIAGNOSIS — I10 Essential (primary) hypertension: Secondary | ICD-10-CM

## 2023-01-25 ENCOUNTER — Encounter: Payer: Self-pay | Admitting: Nurse Practitioner

## 2023-01-26 MED ORDER — BUPROPION HCL ER (XL) 150 MG PO TB24: 150.0000 mg | ORAL_TABLET | Freq: Every day | ORAL | 1 refills | Status: DC

## 2023-03-04 IMAGING — MG MM DIGITAL SCREENING BILAT W/ TOMO AND CAD
8 series · 8 of 24 positions shown · non-contrast
Comparison: Previous exam(s).

CLINICAL DATA: Screening.

EXAM:
DIGITAL SCREENING BILATERAL MAMMOGRAM WITH TOMOSYNTHESIS AND CAD
TECHNIQUE: Bilateral screening digital craniocaudal and mediolateral oblique
mammograms were obtained. Bilateral screening digital breast
tomosynthesis was performed. The images were evaluated with
computer-aided detection.

[L MLO synth-2D]
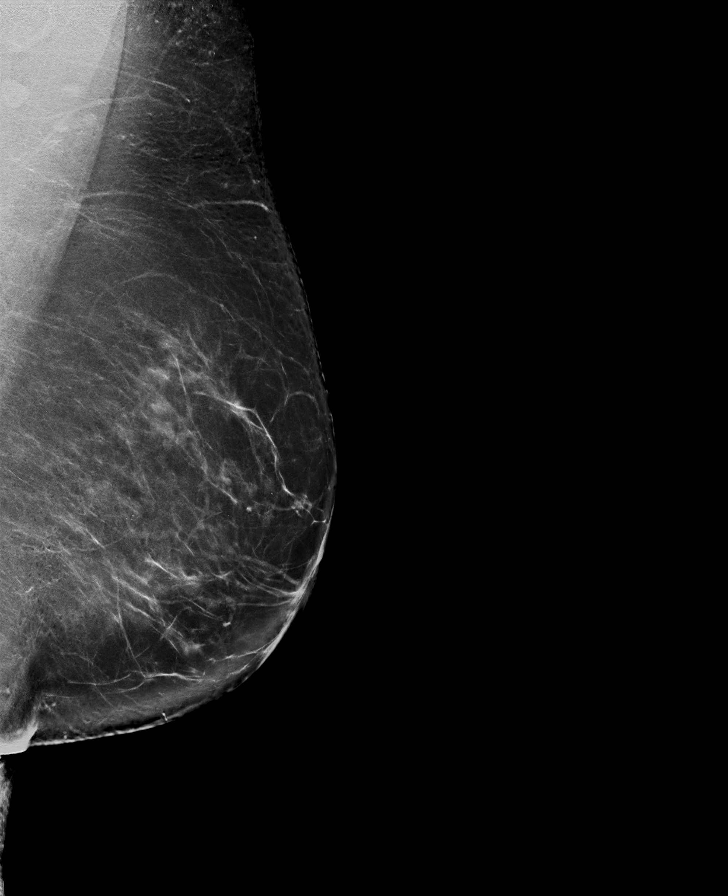

[R CC synth-2D]
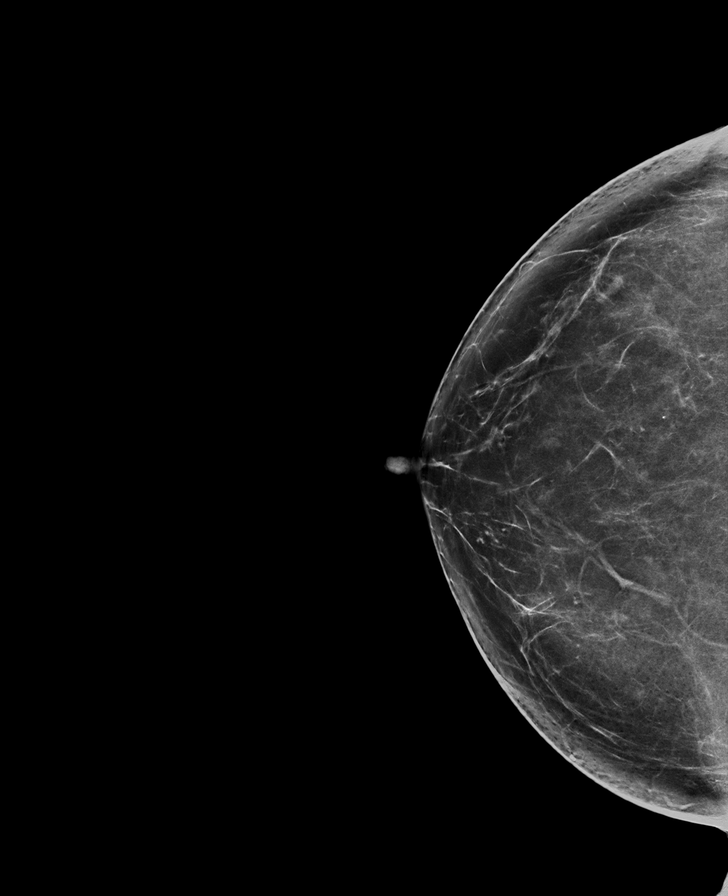

[L CC synth-2D]
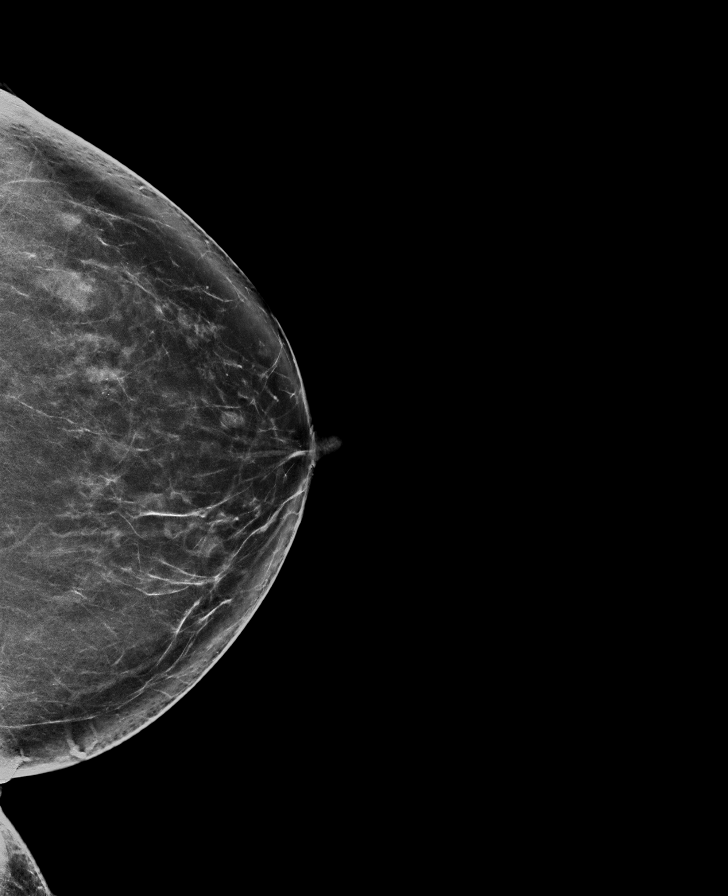

[R MLO synth-2D]
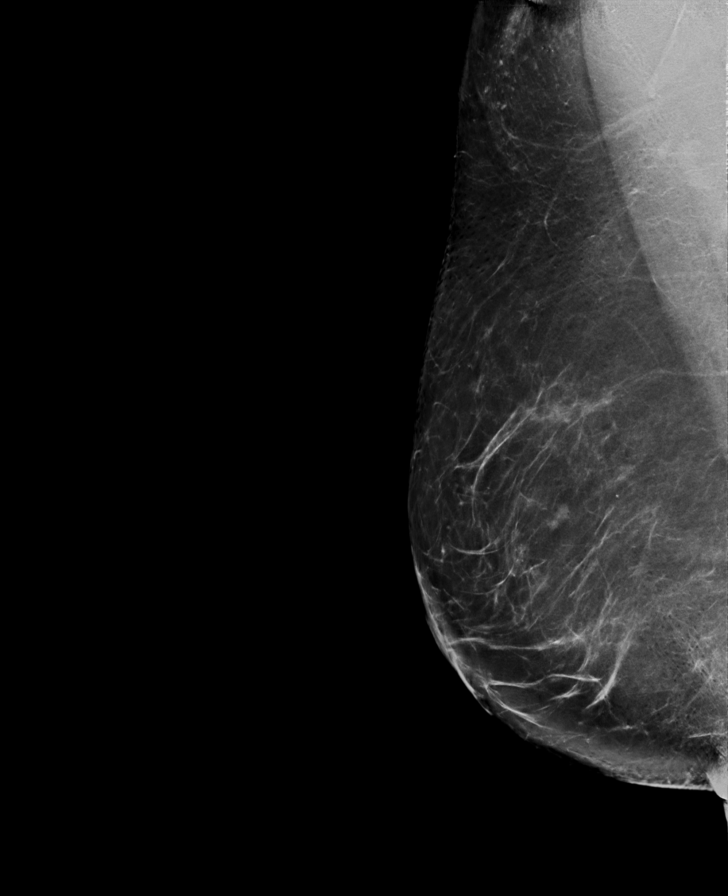

[R MLO tomo · tomo slice 51/100.0]
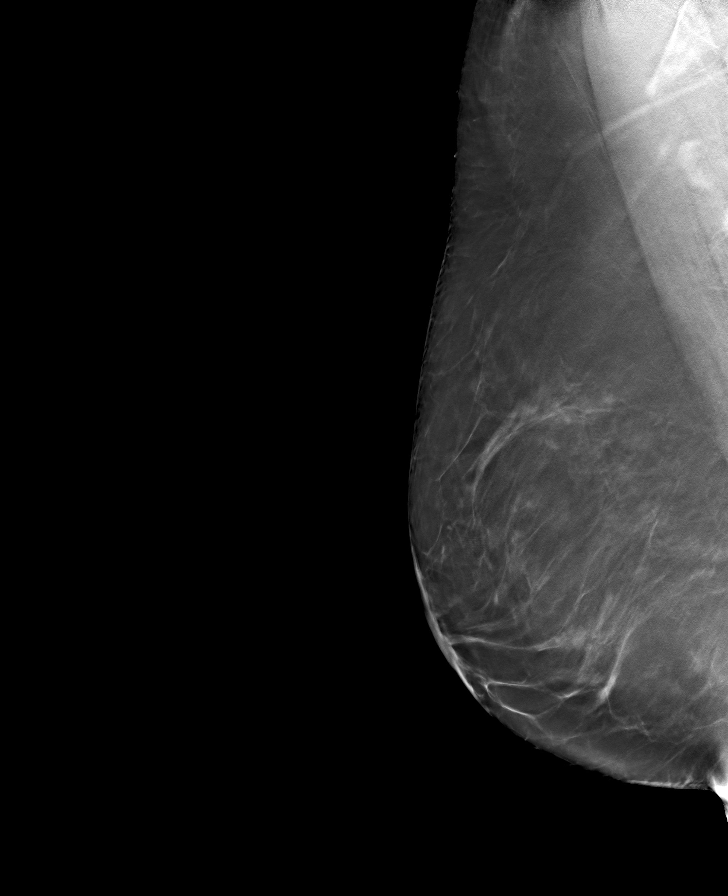

[L MLO tomo · tomo slice 49/98.0]
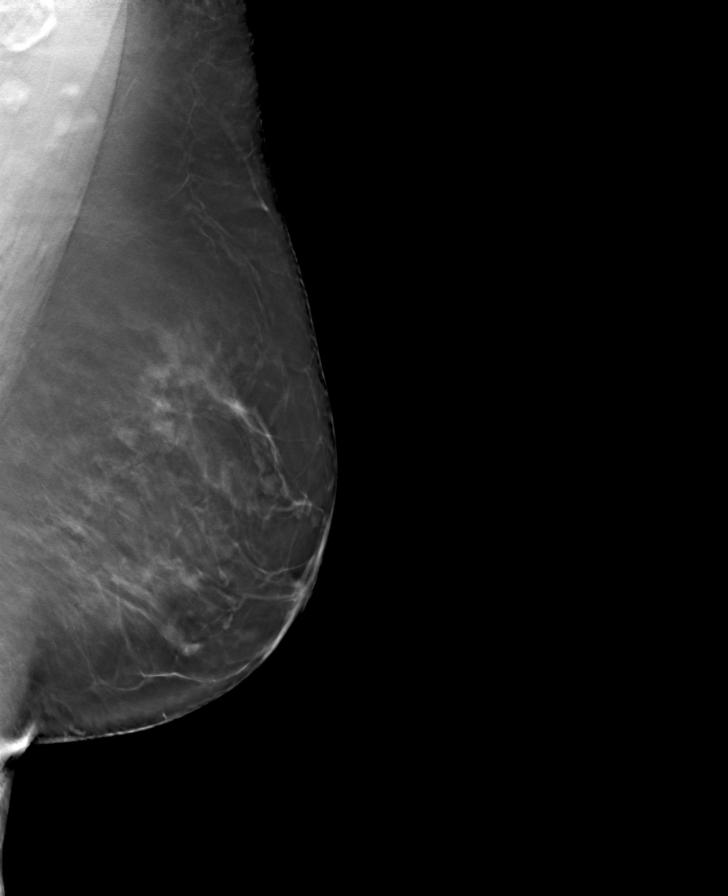

[L CC tomo · tomo slice 43/86.0]
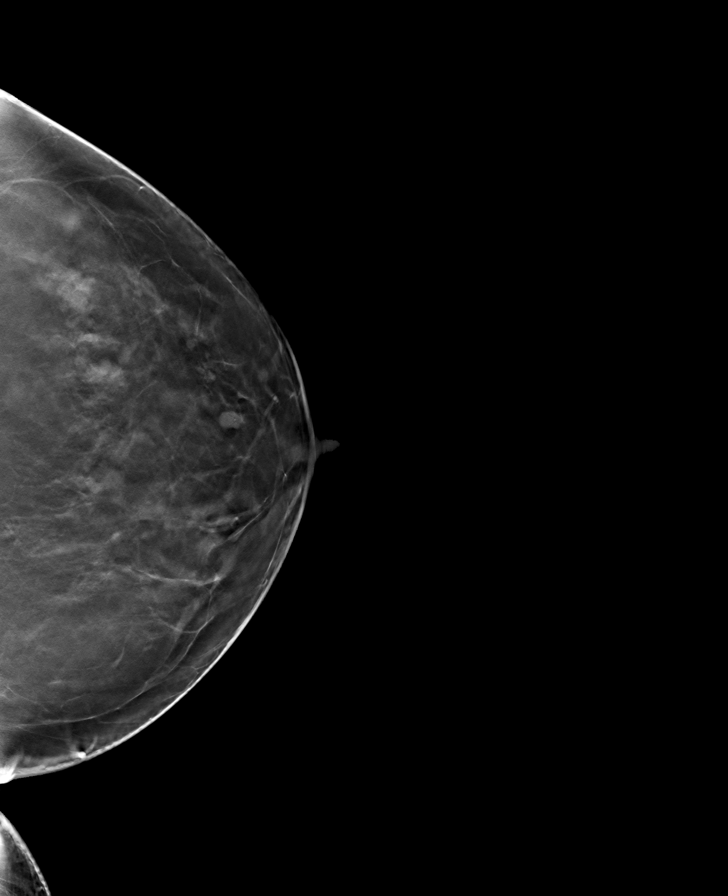

[R CC tomo · tomo slice 43/86.0]
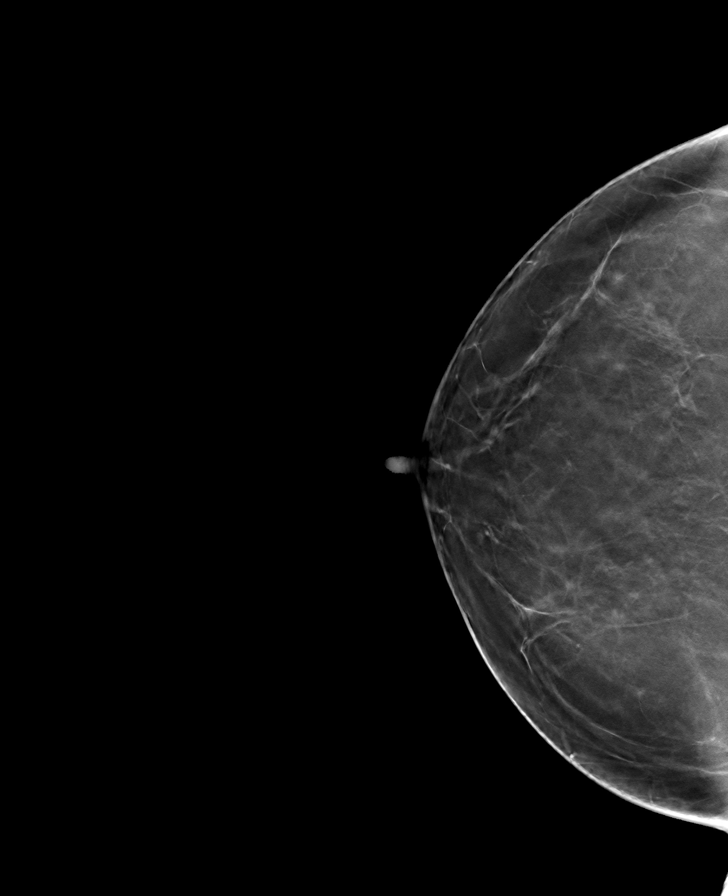

[8 of 24 positions shown; findings below may reference images not displayed]

ACR Breast Density Category b: There are scattered areas of
fibroglandular density.
FINDINGS: There are no findings suspicious for malignancy.
IMPRESSION: No mammographic evidence of malignancy. A result letter of this
screening mammogram will be mailed directly to the patient.

RECOMMENDATION:
Screening mammogram in one year. (Code:51-O-LD2)

BI-RADS CATEGORY  1: Negative.

## 2023-03-08 ENCOUNTER — Other Ambulatory Visit: Payer: Self-pay | Admitting: Nurse Practitioner

## 2023-03-08 DIAGNOSIS — I10 Essential (primary) hypertension: Secondary | ICD-10-CM

## 2023-03-08 NOTE — Telephone Encounter (Signed)
 Requesting: Amlodipine Besylate 5mg  Tablet  Last Visit: 11/16/2022 Next Visit: Visit date not found Last Refill: 01/05/2023  Patient is due for a F/U around 03/16/2023  Please Advise

## 2023-03-14 ENCOUNTER — Other Ambulatory Visit: Payer: Self-pay | Admitting: Nurse Practitioner

## 2023-03-14 DIAGNOSIS — I10 Essential (primary) hypertension: Secondary | ICD-10-CM

## 2023-03-15 MED ORDER — HYDROCHLOROTHIAZIDE 25 MG PO TABS: 25.0000 mg | ORAL_TABLET | Freq: Every day | ORAL | 0 refills | Status: DC

## 2023-03-15 NOTE — Telephone Encounter (Signed)
 Requesting: hydrochlorothiazide (HYDRODIURIL) 25 MG tablet  Last Visit: 11/16/2022 Next Visit: 04/20/2023 Last Refill: 03/08/2023  Please Advise

## 2023-03-26 ENCOUNTER — Other Ambulatory Visit: Payer: Self-pay | Admitting: Nurse Practitioner

## 2023-03-26 DIAGNOSIS — I10 Essential (primary) hypertension: Secondary | ICD-10-CM

## 2023-04-15 ENCOUNTER — Other Ambulatory Visit: Payer: Self-pay | Admitting: Nurse Practitioner

## 2023-04-15 DIAGNOSIS — K2100 Gastro-esophageal reflux disease with esophagitis, without bleeding: Secondary | ICD-10-CM

## 2023-04-17 NOTE — Telephone Encounter (Signed)
 Requesting: Pantoprazole Sodium 20mg  Delayed-Release Tablet  Last Visit: 11/16/2022 Next Visit: 04/20/2023 Last Refill: 01/05/2023  Please Advise

## 2023-04-20 ENCOUNTER — Encounter: Payer: Self-pay | Admitting: Nurse Practitioner

## 2023-04-20 ENCOUNTER — Ambulatory Visit (INDEPENDENT_AMBULATORY_CARE_PROVIDER_SITE_OTHER): Payer: 59 | Admitting: Nurse Practitioner

## 2023-04-20 VITALS — BP 122/80 | HR 68 | Temp 97.0°F | Ht 62.0 in | Wt 224.8 lb

## 2023-04-20 DIAGNOSIS — L918 Other hypertrophic disorders of the skin: Secondary | ICD-10-CM

## 2023-04-20 DIAGNOSIS — I10 Essential (primary) hypertension: Secondary | ICD-10-CM

## 2023-04-20 DIAGNOSIS — E538 Deficiency of other specified B group vitamins: Secondary | ICD-10-CM

## 2023-04-20 DIAGNOSIS — Z0000 Encounter for general adult medical examination without abnormal findings: Secondary | ICD-10-CM

## 2023-04-20 DIAGNOSIS — E559 Vitamin D deficiency, unspecified: Secondary | ICD-10-CM

## 2023-04-20 DIAGNOSIS — Z0001 Encounter for general adult medical examination with abnormal findings: Secondary | ICD-10-CM | POA: Diagnosis not present

## 2023-04-20 DIAGNOSIS — E7800 Pure hypercholesterolemia, unspecified: Secondary | ICD-10-CM | POA: Diagnosis not present

## 2023-04-20 DIAGNOSIS — R7303 Prediabetes: Secondary | ICD-10-CM

## 2023-04-20 DIAGNOSIS — Z23 Encounter for immunization: Secondary | ICD-10-CM | POA: Diagnosis not present

## 2023-04-20 DIAGNOSIS — F419 Anxiety disorder, unspecified: Secondary | ICD-10-CM | POA: Insufficient documentation

## 2023-04-20 DIAGNOSIS — K219 Gastro-esophageal reflux disease without esophagitis: Secondary | ICD-10-CM

## 2023-04-20 LAB — LIPID PANEL
Cholesterol: 194 mg/dL (ref 0–200)
HDL: 66 mg/dL (ref >39.00)
LDL Cholesterol: 98 mg/dL (ref 0–99)
NonHDL: 127.54
Total CHOL/HDL Ratio: 3
Triglycerides: 148 mg/dL (ref 0.0–149.0)
VLDL: 29.6 mg/dL (ref 0.0–40.0)

## 2023-04-20 LAB — COMPREHENSIVE METABOLIC PANEL WITH GFR
ALT: 20 U/L (ref 0–35)
AST: 16 U/L (ref 0–37)
Albumin: 4.3 g/dL (ref 3.5–5.2)
Alkaline Phosphatase: 40 U/L (ref 39–117)
BUN: 16 mg/dL (ref 6–23)
CO2: 32 meq/L (ref 19–32)
Calcium: 9.5 mg/dL (ref 8.4–10.5)
Chloride: 101 meq/L (ref 96–112)
Creatinine, Ser: 1 mg/dL (ref 0.40–1.20)
GFR: 59.61 mL/min — ABNORMAL LOW (ref >60.00)
Glucose, Bld: 106 mg/dL — ABNORMAL HIGH (ref 70–99)
Potassium: 3.7 meq/L (ref 3.5–5.1)
Sodium: 141 meq/L (ref 135–145)
Total Bilirubin: 0.7 mg/dL (ref 0.2–1.2)
Total Protein: 6.6 g/dL (ref 6.0–8.3)

## 2023-04-20 LAB — CBC WITH DIFFERENTIAL/PLATELET
Basophils Absolute: 0.1 10*3/uL (ref 0.0–0.1)
Basophils Relative: 1 % (ref 0.0–3.0)
Eosinophils Absolute: 0.2 10*3/uL (ref 0.0–0.7)
Eosinophils Relative: 2.7 % (ref 0.0–5.0)
HCT: 45 % (ref 36.0–46.0)
Hemoglobin: 15.2 g/dL — ABNORMAL HIGH (ref 12.0–15.0)
Lymphocytes Relative: 43.7 % (ref 12.0–46.0)
Lymphs Abs: 2.9 10*3/uL (ref 0.7–4.0)
MCHC: 33.8 g/dL (ref 30.0–36.0)
MCV: 90.6 fl (ref 78.0–100.0)
Monocytes Absolute: 0.5 10*3/uL (ref 0.1–1.0)
Monocytes Relative: 8.1 % (ref 3.0–12.0)
Neutro Abs: 2.9 10*3/uL (ref 1.4–7.7)
Neutrophils Relative %: 44.5 % (ref 43.0–77.0)
Platelets: 286 10*3/uL (ref 150.0–400.0)
RBC: 4.97 Mil/uL (ref 3.87–5.11)
RDW: 13.2 % (ref 11.5–15.5)
WBC: 6.6 10*3/uL (ref 4.0–10.5)

## 2023-04-20 LAB — VITAMIN B12: Vitamin B-12: 1185 pg/mL — ABNORMAL HIGH (ref 211–911)

## 2023-04-20 LAB — HEMOGLOBIN A1C: Hgb A1c MFr Bld: 5.8 % (ref 4.6–6.5)

## 2023-04-20 LAB — VITAMIN D 25 HYDROXY (VIT D DEFICIENCY, FRACTURES): VITD: 29.73 ng/mL — ABNORMAL LOW (ref 30.00–100.00)

## 2023-04-20 MED ORDER — AMLODIPINE BESYLATE 5 MG PO TABS: 5.0000 mg | ORAL_TABLET | Freq: Every day | ORAL | 3 refills | Status: AC

## 2023-04-20 MED ORDER — SIMVASTATIN 10 MG PO TABS: 10.0000 mg | ORAL_TABLET | Freq: Every day | ORAL | 3 refills | Status: AC

## 2023-04-20 MED ORDER — HYDROCHLOROTHIAZIDE 25 MG PO TABS: 25.0000 mg | ORAL_TABLET | Freq: Every day | ORAL | 3 refills | Status: DC

## 2023-04-20 NOTE — Assessment & Plan Note (Signed)
 She is taking an OTC supplement daily. Check vitamin B12 levels and adjust based on results.

## 2023-04-20 NOTE — Assessment & Plan Note (Signed)
 Chronic, stable. Continue simvastatin 10mg  daily. Check CMP, CBC, lipid panel and treat based on results.

## 2023-04-20 NOTE — Assessment & Plan Note (Signed)
Last A1c was 5.7%.  Will check A1c today. 

## 2023-04-20 NOTE — Assessment & Plan Note (Signed)
Check vitamin D levels and treat based on results.

## 2023-04-20 NOTE — Assessment & Plan Note (Signed)
Health maintenance reviewed and updated. Discussed nutrition, exercise. Follow-up 1 year.

## 2023-04-20 NOTE — Assessment & Plan Note (Signed)
 BMI 41.1. Discussed nutrition and exercise.

## 2023-04-20 NOTE — Assessment & Plan Note (Signed)
 Chronic, stable. Continue wellbutrin 150mg  daily and xanax 0.5mg  daily as needed. She takes the xanax rarely.

## 2023-04-20 NOTE — Assessment & Plan Note (Signed)
 Chronic, stable.  Continue Protonix 20 mg daily.

## 2023-04-20 NOTE — Patient Instructions (Signed)
 It was great to see you!  We are checking your labs today and will let you know the results via mychart/phone.   I am freezing off the 2 skin tags, they may take a few weeks to fall off. If it blisters, do not pop it.   Let's follow-up in 1 year, sooner if you have concerns.  If a referral was placed today, you will be contacted for an appointment. Please note that routine referrals can sometimes take up to 3-4 weeks to process. Please call our office if you haven't heard anything after this time frame.  Take care,  Rodman Pickle, NP

## 2023-04-20 NOTE — Progress Notes (Signed)
 BP 122/80 (BP Location: Left Arm, Patient Position: Sitting, Cuff Size: Normal)   Pulse 68   Temp (!) 97 F (36.1 C)   Ht 5\' 2"  (1.575 m)   Wt 224 lb 12.8 oz (102 kg)   SpO2 98%   BMI 41.12 kg/m    Subjective:    Patient ID: Hannah Ferguson, female    DOB: [birth year], 63 y.o.   MRN: [id number]  CC: Chief Complaint  Patient presents with   Annual Exam    With fasting labs, no concerns    HPI: Hannah Ferguson is a 63 y.o. female presenting on 04/20/2023 for comprehensive medical examination. Current medical complaints include: skin tags on neck  She gets skin tags occasionally on her neck. She usually gets them frozen off at her dermatology, but is wondering if that can be done today. She notes that they get caught in necklaces/clothes.   She currently lives with: husband Menopausal Symptoms: no  Depression and Anxiety Screen done today and results listed below:     04/20/2023    9:03 AM 03/25/2022    9:39 AM 03/25/2022    9:28 AM 03/22/2021    9:08 AM 03/22/2021    8:25 AM  Depression screen PHQ 2/9  Decreased Interest 0 0 0 0 1  Down, Depressed, Hopeless 0 0 0 0 0  PHQ - 2 Score 0 0 0 0 1  Altered sleeping 0 0  0   Tired, decreased energy 0 0  0   Change in appetite 0 0  0   Feeling bad or failure about yourself  0 0  0   Trouble concentrating 0 0  0   Moving slowly or fidgety/restless 0 0  0   Suicidal thoughts 0 0  0   PHQ-9 Score 0 0  0   Difficult doing work/chores Not difficult at all          04/20/2023    9:03 AM 03/25/2022    9:40 AM 03/22/2021    9:07 AM  GAD 7 : Generalized Anxiety Score  Nervous, Anxious, on Edge 0 0 0  Control/stop worrying 0 0 0  Worry too much - different things 0 1 0  Trouble relaxing 0 0 0  Restless 0 0 0  Easily annoyed or irritable 0 0 0  Afraid - awful might happen 0 0 0  Total GAD 7 Score 0 1 0  Anxiety Difficulty Not difficult at all Not difficult at all     The patient does not have a history of falls. I did not complete a  risk assessment for falls. A plan of care for falls was not documented.   Past Medical History:  Past Medical History:  Diagnosis Date   Basal cell carcinoma 2022   COVID-19 03/31/2021   Depression    GERD (gastroesophageal reflux disease)    Heartburn    Hyperlipidemia    Hypertension    Pt denies   Wears contact lenses     Surgical History:  Past Surgical History:  Procedure Laterality Date   COLONOSCOPY WITH PROPOFOL N/A 10/02/2015   Procedure: COLONOSCOPY WITH PROPOFOL;  Surgeon: Midge Minium, MD;  Location: [facility];  Service: Endoscopy;  Laterality: N/A;   COLONOSCOPY WITH PROPOFOL N/A 05/04/2021   Procedure: COLONOSCOPY WITH PROPOFOL;  Surgeon: Toney Reil, MD;  Location: [facility];  Service: Endoscopy;  Laterality: N/A;   ESOPHAGOGASTRODUODENOSCOPY  05/04/2021   Procedure: ESOPHAGOGASTRODUODENOSCOPY (EGD);  Surgeon:  Toney Reil, MD;  Location: [facility];  Service: Endoscopy;;   KNEE SURGERY     POLYPECTOMY  10/02/2015   Procedure: POLYPECTOMY;  Surgeon: Midge Minium, MD;  Location: [facility];  Service: Endoscopy;;   TONSILLECTOMY  2006    Medications:  Current Outpatient Medications on File Prior to Visit  Medication Sig   ALPRAZolam (XANAX) 0.5 MG tablet Take 1 tablet (0.5 mg total) by mouth at bedtime as needed for anxiety.   aspirin EC 81 MG tablet Take 81 mg by mouth daily.   buPROPion (WELLBUTRIN XL) 150 MG 24 hr tablet Take 1 tablet (150 mg total) by mouth daily.   calcium carbonate (OS-CAL - DOSED IN MG OF ELEMENTAL CALCIUM) 1250 (500 Ca) MG tablet Take 1 tablet by mouth.   Cranberry 500 MG CAPS Take one by mouth daily   cyanocobalamin (VITAMIN B12) 500 MCG tablet Take 500 mcg by mouth every other day.   pantoprazole (PROTONIX) 20 MG tablet Take 1 tablet by mouth daily.   tretinoin (RETIN-A) 0.025 % cream SMARTSIG:1 sparingly Topical Every Night (Patient not taking: Reported on 04/20/2023)   No current  facility-administered medications on file prior to visit.    Allergies:  Allergies  Allergen Reactions   Sulfa Antibiotics Rash    Social History:  Social History   Socioeconomic History   Marital status: Married    Spouse name: Not on file   Number of children: Not on file   Years of education: Not on file   Highest education level: Bachelor's degree (e.g., BA, AB, BS)  Occupational History   Not on file  Tobacco Use   Smoking status: Former    Current packs/day: 0.00    Average packs/day: 0.3 packs/day for 4.0 years (1.0 ttl pk-yrs)    Types: Cigarettes    Start date: 01/17/1981    Quit date: 01/17/1985    Years since quitting: 38.2   Smokeless tobacco: Never  Vaping Use   Vaping status: Never Used  Substance and Sexual Activity   Alcohol use: Yes    Alcohol/week: 2.0 standard drinks of alcohol    Types: 2 Glasses of wine per week    Comment: occasionally   Drug use: No   Sexual activity: Not Currently  Other Topics Concern   Not on file  Social History Narrative   Not on file   Social Drivers of Health   Financial Resource Strain: Low Risk  (04/17/2023)   Overall Financial Resource Strain (CARDIA)    Difficulty of Paying Living Expenses: Not hard at all  Food Insecurity: No Food Insecurity (04/17/2023)   Hunger Vital Sign    Worried About Running Out of Food in the Last Year: Never true    Ran Out of Food in the Last Year: Never true  Transportation Needs: No Transportation Needs (04/17/2023)   PRAPARE - Administrator, Civil Service (Medical): No    Lack of Transportation (Non-Medical): No  Physical Activity: Insufficiently Active (04/17/2023)   Exercise Vital Sign    Days of Exercise per Week: 3 days    Minutes of Exercise per Session: 30 min  Stress: No Stress Concern Present (04/17/2023)   [employer] of Occupational Health - Occupational Stress Questionnaire    Feeling of Stress : Not at all  Social Connections: Socially Integrated (04/17/2023)    Social Connection and Isolation Panel [NHANES]    Frequency of Communication with Friends and Family: More than three times a week  Frequency of Social Gatherings with Friends and Family: More than three times a week    Attends Religious Services: More than 4 times per year    Active Member of [employer] or Organizations: Yes    Attends Engineer, structural: More than 4 times per year    Marital Status: Married  Catering manager Violence: Not on file   Social History   Tobacco Use  Smoking Status Former   Current packs/day: 0.00   Average packs/day: 0.3 packs/day for 4.0 years (1.0 ttl pk-yrs)   Types: Cigarettes   Start date: 01/17/1981   Quit date: 01/17/1985   Years since quitting: 38.2  Smokeless Tobacco Never   Social History   Substance and Sexual Activity  Alcohol Use Yes   Alcohol/week: 2.0 standard drinks of alcohol   Types: 2 Glasses of wine per week   Comment: occasionally    Family History:  Family History  Problem Relation Age of Onset   Multiple sclerosis Mother    Cancer Father 103       stomach cancer died age 51   Breast cancer Maternal Grandmother 68   Drug abuse Brother    Kidney disease Neg Hx    Bladder Cancer Neg Hx     Past medical history, surgical history, medications, allergies, family history and social history reviewed with patient today and changes made to appropriate areas of the chart.   Review of Systems  Constitutional: Negative.   HENT: Negative.    Eyes: Negative.   Respiratory: Negative.    Cardiovascular: Negative.   Gastrointestinal: Negative.   Genitourinary: Negative.   Musculoskeletal:  Positive for back pain and joint pain.  Skin: Negative.   Neurological: Negative.   Psychiatric/Behavioral: Negative.     All other ROS negative except what is listed above and in the HPI.      Objective:    BP 122/80 (BP Location: Left Arm, Patient Position: Sitting, Cuff Size: Normal)   Pulse 68   Temp (!) 97 F (36.1 C)    Ht 5\' 2"  (1.575 m)   Wt 224 lb 12.8 oz (102 kg)   SpO2 98%   BMI 41.12 kg/m   Wt Readings from Last 3 Encounters:  04/20/23 224 lb 12.8 oz (102 kg)  11/16/22 222 lb (100.7 kg)  03/25/22 220 lb (99.8 kg)    Physical Exam Vitals and nursing note reviewed.  Constitutional:      General: She is not in acute distress.    Appearance: Normal appearance.  HENT:     Head: Normocephalic and atraumatic.     Right Ear: Tympanic membrane, ear canal and external ear normal.     Left Ear: Tympanic membrane, ear canal and external ear normal.     Mouth/Throat:     Mouth: Mucous membranes are moist.     Pharynx: No posterior oropharyngeal erythema.  Eyes:     Conjunctiva/sclera: Conjunctivae normal.  Cardiovascular:     Rate and Rhythm: Normal rate and regular rhythm.     Pulses: Normal pulses.     Heart sounds: Normal heart sounds.  Pulmonary:     Effort: Pulmonary effort is normal.     Breath sounds: Normal breath sounds.  Abdominal:     Palpations: Abdomen is soft.     Tenderness: There is no abdominal tenderness.  Musculoskeletal:        General: No tenderness. Normal range of motion.     Cervical back: Normal range of motion and neck  supple.     Right lower leg: No edema.     Left lower leg: No edema.  Lymphadenopathy:     Cervical: No cervical adenopathy.  Skin:    General: Skin is warm and dry.     Comments: 2 small skin tags to left neck  Neurological:     General: No focal deficit present.     Mental Status: She is alert and oriented to person, place, and time.     Cranial Nerves: No cranial nerve deficit.     Coordination: Coordination normal.     Gait: Gait normal.  Psychiatric:        Mood and Affect: Mood normal.        Behavior: Behavior normal.        Thought Content: Thought content normal.        Judgment: Judgment normal.     Results for orders placed or performed in visit on 03/25/22  CBC with Differential/Platelet   Collection Time: 03/25/22  9:23 AM   Result Value Ref Range   WBC 6.4 4.0 - 10.5 K/uL   RBC 5.19 (H) 3.87 - 5.11 Mil/uL   Hemoglobin 15.9 (H) 12.0 - 15.0 g/dL   HCT 78.4 (H) 69.6 - 29.5 %   MCV 90.3 78.0 - 100.0 fl   MCHC 33.8 30.0 - 36.0 g/dL   RDW 28.4 13.2 - 44.0 %   Platelets 283.0 150.0 - 400.0 K/uL   Neutrophils Relative % 44.0 43.0 - 77.0 %   Lymphocytes Relative 43.2 12.0 - 46.0 %   Monocytes Relative 8.5 3.0 - 12.0 %   Eosinophils Relative 3.7 0.0 - 5.0 %   Basophils Relative 0.6 0.0 - 3.0 %   Neutro Abs 2.8 1.4 - 7.7 K/uL   Lymphs Abs 2.8 0.7 - 4.0 K/uL   Monocytes Absolute 0.5 0.1 - 1.0 K/uL   Eosinophils Absolute 0.2 0.0 - 0.7 K/uL   Basophils Absolute 0.0 0.0 - 0.1 K/uL  Comprehensive metabolic panel   Collection Time: 03/25/22  9:23 AM  Result Value Ref Range   Sodium 143 135 - 145 mEq/L   Potassium 3.5 3.5 - 5.1 mEq/L   Chloride 102 96 - 112 mEq/L   CO2 31 19 - 32 mEq/L   Glucose, Bld 100 (H) 70 - 99 mg/dL   BUN 17 6 - 23 mg/dL   Creatinine, Ser 1.02 0.40 - 1.20 mg/dL   Total Bilirubin 0.6 0.2 - 1.2 mg/dL   Alkaline Phosphatase 42 39 - 117 U/L   AST 15 0 - 37 U/L   ALT 19 0 - 35 U/L   Total Protein 6.7 6.0 - 8.3 g/dL   Albumin 4.0 3.5 - 5.2 g/dL   GFR 72.53 >66.44 mL/min   Calcium 9.6 8.4 - 10.5 mg/dL  TSH   Collection Time: 03/25/22  9:23 AM  Result Value Ref Range   TSH 2.80 0.35 - 5.50 uIU/mL  Hemoglobin A1c   Collection Time: 03/25/22  9:23 AM  Result Value Ref Range   Hgb A1c MFr Bld 5.7 4.6 - 6.5 %  Lipid panel   Collection Time: 03/25/22  9:23 AM  Result Value Ref Range   Cholesterol 187 0 - 200 mg/dL   Triglycerides 034.7 0.0 - 149.0 mg/dL   HDL 42.59 >56.38 mg/dL   VLDL 75.6 0.0 - 43.3 mg/dL   LDL Cholesterol 92 0 - 99 mg/dL   Total CHOL/HDL Ratio 3    NonHDL 112.80   Vitamin B12  Collection Time: 03/25/22  9:23 AM  Result Value Ref Range   Vitamin B-12 288 211 - 911 pg/mL      Assessment & Plan:   Problem List Items Addressed This Visit       Cardiovascular  and Mediastinum   Hypertension   Chronic, stable.  We will continue the current antihypertensive regimen including amlodipine 5mg  daily and hydrochlorothiazide 25mg  daily.  Check CMP, CBC today.       Relevant Medications   amLODipine (NORVASC) 5 MG tablet   hydrochlorothiazide (HYDRODIURIL) 25 MG tablet   simvastatin (ZOCOR) 10 MG tablet   Other Relevant Orders   CBC with Differential/Platelet   Comprehensive metabolic panel with GFR     Digestive   Chronic GERD   Chronic, stable.  Continue Protonix 20 mg daily.          Other   Hyperlipidemia   Chronic, stable. Continue simvastatin 10mg  daily. Check CMP, CBC, lipid panel and treat based on results.       Relevant Medications   amLODipine (NORVASC) 5 MG tablet   hydrochlorothiazide (HYDRODIURIL) 25 MG tablet   simvastatin (ZOCOR) 10 MG tablet   Other Relevant Orders   CBC with Differential/Platelet   Comprehensive metabolic panel with GFR   Lipid panel   Routine general medical examination at a health care facility - Primary   Health maintenance reviewed and updated. Discussed nutrition, exercise. Follow-up 1 year.        B12 deficiency   She is taking an OTC supplement daily. Check vitamin B12 levels and adjust based on results.       Relevant Orders   Vitamin B12   Morbid obesity (HCC)   BMI 41.1. Discussed nutrition and exercise.       Prediabetes   Last A1c was 5.7%.  Will check A1c today.      Relevant Orders   Hemoglobin A1c   Anxiety   Chronic, stable. Continue wellbutrin 150mg  daily and xanax 0.5mg  daily as needed. She takes the xanax rarely.       Vitamin D deficiency   Check vitamin D levels and treat based on results.      Relevant Orders   VITAMIN D 25 Hydroxy (Vit-D Deficiency, Fractures)   Other Visit Diagnoses       Immunization due       Tdap and prevnar 20 given today   Relevant Orders   Tdap vaccine greater than or equal to 7yo IM   Pneumococcal conjugate vaccine 20-valent      Skin tag       2 small skin tags to left neck getting caught in clothes/jewelry. See procedure below.       Skin Procedure  Diagnosis: Skin tags  Lesion Location/Size: 2 small skin tags to left neck Provider: Rodman Pickle, NP Consent:  Risks, benefits, and alternative treatments discussed and all questions were answered.  Patient elected to proceed and verbal consent obtained.  Description: Area prepped and draped using semi-sterile technique.  Histofreezer applied to 2 lesions for 1 cycle. Tolerated procedure well.  Complications: None Estimated Blood Loss: None Post Procedure Instructions: Wound care instructions discussed and patient was instructed to keep area clean and dry.  Signs and symptoms of infection discussed, patient agrees to contact the office ASAP should they occur.  Patient informed that areas of cryotherapy possible will blister.  Encouraged avoidance of popping blister.  Application of  bacitracin recommended should blistering occur.   Follow Up: with any concerns  Follow up plan: Return in about 1 year (around 04/19/2024) for CPE.   LABORATORY TESTING:  - Pap smear: up to date  IMMUNIZATIONS:   - Tdap: Tetanus vaccination status reviewed: Td vaccination indicated and given today. - Influenza: Up to date - Pneumovax: Not applicable - Prevnar: Administered today - HPV: Not applicable - Shingrix vaccine: Up to date  SCREENING: -Mammogram: Ordered today  - Colonoscopy: Up to date  - Bone Density: Not applicable   PATIENT COUNSELING:   Advised to take 1 mg of folate supplement per day if capable of pregnancy.   Sexuality: Discussed sexually transmitted diseases, partner selection, use of condoms, avoidance of unintended pregnancy  and contraceptive alternatives.   Advised to avoid cigarette smoking.  I discussed with the patient that most people either abstain from alcohol or drink within safe limits (<=14/week and <=4 drinks/occasion for males, <=7/weeks  and <= 3 drinks/occasion for females) and that the risk for alcohol disorders and other health effects rises proportionally with the number of drinks per week and how often a drinker exceeds daily limits.  Discussed cessation/primary prevention of drug use and availability of treatment for abuse.   Diet: Encouraged to adjust caloric intake to maintain  or achieve ideal body weight, to reduce intake of dietary saturated fat and total fat, to limit sodium intake by avoiding high sodium foods and not adding table salt, and to maintain adequate dietary potassium and calcium preferably from fresh fruits, vegetables, and low-fat dairy products.    stressed the importance of regular exercise  Injury prevention: Discussed safety belts, safety helmets, smoke detector, smoking near bedding or upholstery.   Dental health: Discussed importance of regular tooth brushing, flossing, and dental visits.    NEXT PREVENTATIVE PHYSICAL DUE IN 1 YEAR. Return in about 1 year (around 04/19/2024) for CPE.  Grier Czerwinski A Jody Silas

## 2023-04-20 NOTE — Assessment & Plan Note (Signed)
 Chronic, stable.  We will continue the current antihypertensive regimen including amlodipine 5mg  daily and hydrochlorothiazide 25mg  daily.  Check CMP, CBC today.

## 2023-05-24 ENCOUNTER — Other Ambulatory Visit: Payer: Self-pay | Admitting: Nurse Practitioner

## 2023-05-24 DIAGNOSIS — Z1231 Encounter for screening mammogram for malignant neoplasm of breast: Secondary | ICD-10-CM

## 2023-06-08 ENCOUNTER — Ambulatory Visit
Admission: RE | Admit: 2023-06-08 | Discharge: 2023-06-08 | Disposition: A | Discharge status: Home / Self Care | Source: Ambulatory Visit | Attending: Nurse Practitioner | Admitting: Nurse Practitioner

## 2023-06-08 DIAGNOSIS — Z1231 Encounter for screening mammogram for malignant neoplasm of breast: Secondary | ICD-10-CM | POA: Insufficient documentation

## 2023-06-12 ENCOUNTER — Ambulatory Visit: Payer: Self-pay | Admitting: Nurse Practitioner

## 2023-06-16 ENCOUNTER — Other Ambulatory Visit: Payer: Self-pay | Admitting: Nurse Practitioner

## 2023-06-16 DIAGNOSIS — K2100 Gastro-esophageal reflux disease with esophagitis, without bleeding: Secondary | ICD-10-CM

## 2023-06-16 NOTE — Telephone Encounter (Signed)
 Requesting: Pantoprazole  Sodium 20mg  Delayed-Release Tablet  Last Visit: 04/20/2023 Next Visit: Visit date not found Last Refill: 04/17/2023  Please Advise

## 2023-07-21 ENCOUNTER — Other Ambulatory Visit: Payer: Self-pay | Admitting: Nurse Practitioner

## 2023-07-21 NOTE — Telephone Encounter (Signed)
 Requesting: BUPROPION  HCL XL 150 MG TABLET   Last Visit: 04/20/2023 Next Visit: Visit date not found Last Refill: 01/26/2023  Please Advise

## 2023-10-25 ENCOUNTER — Encounter: Payer: Self-pay | Admitting: Nurse Practitioner

## 2023-10-25 DIAGNOSIS — G479 Sleep disorder, unspecified: Secondary | ICD-10-CM

## 2023-10-25 MED ORDER — ALPRAZOLAM 0.5 MG PO TABS: 0.5000 mg | ORAL_TABLET | Freq: Every evening | ORAL | 0 refills | Status: AC | PRN

## 2023-10-25 NOTE — Telephone Encounter (Signed)
 Requesting: ALPRAZolam  (XANAX ) 0.5 MG tablet  Last Visit: 04/20/2023 Next Visit: Visit date not found Last Refill: 06/22/2022 Please Advise

## 2023-12-06 ENCOUNTER — Other Ambulatory Visit: Payer: Self-pay | Admitting: Nurse Practitioner

## 2023-12-06 DIAGNOSIS — I10 Essential (primary) hypertension: Secondary | ICD-10-CM

## 2024-01-19 ENCOUNTER — Other Ambulatory Visit: Payer: Self-pay | Admitting: Nurse Practitioner

## 2024-01-19 NOTE — Telephone Encounter (Signed)
 Requesting: BUPROPION  HCL XL 150 MG TABLET  Last Visit: 04/20/2023 Next Visit: Visit date not found Last Refill: 07/21/2023  Please Advise
